# Patient Record
Sex: Female | Born: 1960 | Race: Black or African American | Hispanic: No | Marital: Single | Smoking: Never smoker
Health system: Southern US, Community
[De-identification: ages and names within clinical notes are randomized; demographics above are authoritative.]

## PROBLEM LIST (undated history)

## (undated) DIAGNOSIS — M1990 Unspecified osteoarthritis, unspecified site: Secondary | ICD-10-CM

## (undated) DIAGNOSIS — E559 Vitamin D deficiency, unspecified: Secondary | ICD-10-CM

## (undated) DIAGNOSIS — E739 Lactose intolerance, unspecified: Secondary | ICD-10-CM

## (undated) DIAGNOSIS — N760 Acute vaginitis: Secondary | ICD-10-CM

## (undated) DIAGNOSIS — A599 Trichomoniasis, unspecified: Secondary | ICD-10-CM

## (undated) DIAGNOSIS — K769 Liver disease, unspecified: Secondary | ICD-10-CM

## (undated) DIAGNOSIS — A549 Gonococcal infection, unspecified: Secondary | ICD-10-CM

## (undated) DIAGNOSIS — N809 Endometriosis, unspecified: Secondary | ICD-10-CM

## (undated) DIAGNOSIS — D219 Benign neoplasm of connective and other soft tissue, unspecified: Secondary | ICD-10-CM

## (undated) DIAGNOSIS — K760 Fatty (change of) liver, not elsewhere classified: Secondary | ICD-10-CM

## (undated) DIAGNOSIS — K5900 Constipation, unspecified: Secondary | ICD-10-CM

## (undated) DIAGNOSIS — N939 Abnormal uterine and vaginal bleeding, unspecified: Secondary | ICD-10-CM

## (undated) DIAGNOSIS — Z9889 Other specified postprocedural states: Secondary | ICD-10-CM

## (undated) DIAGNOSIS — B9689 Other specified bacterial agents as the cause of diseases classified elsewhere: Secondary | ICD-10-CM

## (undated) DIAGNOSIS — M549 Dorsalgia, unspecified: Secondary | ICD-10-CM

## (undated) DIAGNOSIS — I10 Essential (primary) hypertension: Secondary | ICD-10-CM

## (undated) DIAGNOSIS — R011 Cardiac murmur, unspecified: Secondary | ICD-10-CM

## (undated) DIAGNOSIS — N946 Dysmenorrhea, unspecified: Secondary | ICD-10-CM

## (undated) DIAGNOSIS — K219 Gastro-esophageal reflux disease without esophagitis: Secondary | ICD-10-CM

## (undated) DIAGNOSIS — Z87442 Personal history of urinary calculi: Secondary | ICD-10-CM

## (undated) DIAGNOSIS — B009 Herpesviral infection, unspecified: Secondary | ICD-10-CM

## (undated) DIAGNOSIS — D869 Sarcoidosis, unspecified: Secondary | ICD-10-CM

## (undated) DIAGNOSIS — B379 Candidiasis, unspecified: Secondary | ICD-10-CM

## (undated) DIAGNOSIS — Z8619 Personal history of other infectious and parasitic diseases: Secondary | ICD-10-CM

## (undated) DIAGNOSIS — N83209 Unspecified ovarian cyst, unspecified side: Secondary | ICD-10-CM

## (undated) DIAGNOSIS — D649 Anemia, unspecified: Secondary | ICD-10-CM

## (undated) DIAGNOSIS — M2550 Pain in unspecified joint: Secondary | ICD-10-CM

## (undated) DIAGNOSIS — R112 Nausea with vomiting, unspecified: Secondary | ICD-10-CM

## (undated) DIAGNOSIS — J189 Pneumonia, unspecified organism: Secondary | ICD-10-CM

## (undated) HISTORY — DX: Herpesviral infection, unspecified: B00.9

## (undated) HISTORY — DX: Endometriosis, unspecified: N80.9

## (undated) HISTORY — DX: Liver disease, unspecified: K76.9

## (undated) HISTORY — PX: LAPAROSCOPIC OVARIAN CYSTECTOMY: SUR786

## (undated) HISTORY — DX: Abnormal uterine and vaginal bleeding, unspecified: N93.9

## (undated) HISTORY — DX: Acute vaginitis: N76.0

## (undated) HISTORY — DX: Gastro-esophageal reflux disease without esophagitis: K21.9

## (undated) HISTORY — DX: Personal history of other infectious and parasitic diseases: Z86.19

## (undated) HISTORY — DX: Pain in unspecified joint: M25.50

## (undated) HISTORY — DX: Unspecified ovarian cyst, unspecified side: N83.209

## (undated) HISTORY — DX: Constipation, unspecified: K59.00

## (undated) HISTORY — DX: Lactose intolerance, unspecified: E73.9

## (undated) HISTORY — DX: Benign neoplasm of connective and other soft tissue, unspecified: D21.9

## (undated) HISTORY — PX: ABDOMINAL HYSTERECTOMY: SHX81

## (undated) HISTORY — DX: Dorsalgia, unspecified: M54.9

## (undated) HISTORY — DX: Gonococcal infection, unspecified: A54.9

## (undated) HISTORY — DX: Essential (primary) hypertension: I10

## (undated) HISTORY — DX: Candidiasis, unspecified: B37.9

## (undated) HISTORY — PX: OTHER SURGICAL HISTORY: SHX169

## (undated) HISTORY — DX: Fatty (change of) liver, not elsewhere classified: K76.0

## (undated) HISTORY — PX: DILATION AND CURETTAGE OF UTERUS: SHX78

## (undated) HISTORY — DX: Anemia, unspecified: D64.9

## (undated) HISTORY — DX: Other specified bacterial agents as the cause of diseases classified elsewhere: B96.89

## (undated) HISTORY — PX: WISDOM TOOTH EXTRACTION: SHX21

## (undated) HISTORY — DX: Dysmenorrhea, unspecified: N94.6

## (undated) HISTORY — DX: Trichomoniasis, unspecified: A59.9

## (undated) HISTORY — DX: Vitamin D deficiency, unspecified: E55.9

## (undated) HISTORY — PX: FOOT SURGERY: SHX648

---

## 1998-02-19 ENCOUNTER — Encounter: Payer: Self-pay | Admitting: Gastroenterology

## 1998-02-19 ENCOUNTER — Ambulatory Visit (HOSPITAL_COMMUNITY): Admission: RE | Admit: 1998-02-19 | Discharge: 1998-02-19 | Payer: Self-pay | Admitting: Gastroenterology

## 1998-05-11 ENCOUNTER — Ambulatory Visit: Admission: RE | Admit: 1998-05-11 | Discharge: 1998-05-11 | Payer: Self-pay | Admitting: Family Medicine

## 1999-02-16 ENCOUNTER — Other Ambulatory Visit: Admission: RE | Admit: 1999-02-16 | Discharge: 1999-02-16 | Payer: Self-pay | Admitting: Obstetrics and Gynecology

## 1999-10-24 ENCOUNTER — Encounter: Payer: Self-pay | Admitting: Obstetrics and Gynecology

## 1999-10-24 ENCOUNTER — Ambulatory Visit (HOSPITAL_COMMUNITY): Admission: RE | Admit: 1999-10-24 | Discharge: 1999-10-24 | Payer: Self-pay | Admitting: Obstetrics and Gynecology

## 1999-10-24 ENCOUNTER — Encounter (INDEPENDENT_AMBULATORY_CARE_PROVIDER_SITE_OTHER): Payer: Self-pay | Admitting: Specialist

## 2000-02-09 ENCOUNTER — Other Ambulatory Visit: Admission: RE | Admit: 2000-02-09 | Discharge: 2000-02-09 | Payer: Self-pay | Admitting: Obstetrics and Gynecology

## 2000-08-23 ENCOUNTER — Encounter (INDEPENDENT_AMBULATORY_CARE_PROVIDER_SITE_OTHER): Payer: Self-pay | Admitting: Specialist

## 2000-08-23 ENCOUNTER — Inpatient Hospital Stay (HOSPITAL_COMMUNITY): Admission: EM | Admit: 2000-08-23 | Discharge: 2000-09-01 | Payer: Self-pay | Admitting: Internal Medicine

## 2000-08-25 ENCOUNTER — Encounter: Payer: Self-pay | Admitting: Critical Care Medicine

## 2000-08-26 ENCOUNTER — Encounter: Payer: Self-pay | Admitting: Internal Medicine

## 2000-08-27 ENCOUNTER — Encounter: Payer: Self-pay | Admitting: Internal Medicine

## 2000-08-29 ENCOUNTER — Encounter: Payer: Self-pay | Admitting: Gastroenterology

## 2000-09-14 ENCOUNTER — Ambulatory Visit (HOSPITAL_COMMUNITY): Admission: RE | Admit: 2000-09-14 | Discharge: 2000-09-14 | Payer: Self-pay | Admitting: Gastroenterology

## 2000-10-03 ENCOUNTER — Observation Stay (HOSPITAL_COMMUNITY): Admission: RE | Admit: 2000-10-03 | Discharge: 2000-10-04 | Payer: Self-pay | Admitting: Gastroenterology

## 2000-10-23 ENCOUNTER — Ambulatory Visit (HOSPITAL_COMMUNITY): Admission: RE | Admit: 2000-10-23 | Discharge: 2000-10-23 | Payer: Self-pay | Admitting: Family Medicine

## 2000-10-23 ENCOUNTER — Encounter: Payer: Self-pay | Admitting: Family Medicine

## 2000-11-08 ENCOUNTER — Encounter: Payer: Self-pay | Admitting: Gastroenterology

## 2000-11-08 ENCOUNTER — Inpatient Hospital Stay (HOSPITAL_COMMUNITY): Admission: EM | Admit: 2000-11-08 | Discharge: 2000-11-11 | Payer: Self-pay | Admitting: Emergency Medicine

## 2001-02-12 ENCOUNTER — Other Ambulatory Visit: Admission: RE | Admit: 2001-02-12 | Discharge: 2001-02-12 | Payer: Self-pay | Admitting: Obstetrics and Gynecology

## 2001-02-13 ENCOUNTER — Ambulatory Visit (HOSPITAL_COMMUNITY): Admission: RE | Admit: 2001-02-13 | Discharge: 2001-02-13 | Payer: Self-pay | Admitting: Obstetrics and Gynecology

## 2001-02-13 ENCOUNTER — Encounter: Payer: Self-pay | Admitting: Obstetrics and Gynecology

## 2001-07-05 ENCOUNTER — Encounter: Admission: RE | Admit: 2001-07-05 | Discharge: 2001-10-03 | Payer: Self-pay | Admitting: Gastroenterology

## 2001-08-05 ENCOUNTER — Encounter: Payer: Self-pay | Admitting: Family Medicine

## 2001-08-05 ENCOUNTER — Ambulatory Visit (HOSPITAL_COMMUNITY): Admission: RE | Admit: 2001-08-05 | Discharge: 2001-08-05 | Payer: Self-pay | Admitting: Family Medicine

## 2001-11-06 ENCOUNTER — Encounter: Admission: RE | Admit: 2001-11-06 | Discharge: 2002-02-04 | Payer: Self-pay | Admitting: Gastroenterology

## 2002-02-12 ENCOUNTER — Other Ambulatory Visit: Admission: RE | Admit: 2002-02-12 | Discharge: 2002-02-12 | Payer: Self-pay | Admitting: Obstetrics and Gynecology

## 2002-04-29 ENCOUNTER — Ambulatory Visit (HOSPITAL_COMMUNITY): Admission: RE | Admit: 2002-04-29 | Discharge: 2002-04-29 | Payer: Self-pay | Admitting: Obstetrics and Gynecology

## 2002-04-29 ENCOUNTER — Encounter: Payer: Self-pay | Admitting: Obstetrics and Gynecology

## 2003-03-09 ENCOUNTER — Other Ambulatory Visit: Admission: RE | Admit: 2003-03-09 | Discharge: 2003-03-09 | Payer: Self-pay | Admitting: Obstetrics and Gynecology

## 2004-02-25 ENCOUNTER — Other Ambulatory Visit: Admission: RE | Admit: 2004-02-25 | Discharge: 2004-02-25 | Payer: Self-pay | Admitting: Obstetrics and Gynecology

## 2004-07-05 ENCOUNTER — Ambulatory Visit (HOSPITAL_COMMUNITY): Admission: RE | Admit: 2004-07-05 | Discharge: 2004-07-05 | Payer: Self-pay | Admitting: Family Medicine

## 2005-02-15 ENCOUNTER — Other Ambulatory Visit: Admission: RE | Admit: 2005-02-15 | Discharge: 2005-02-15 | Payer: Self-pay | Admitting: Obstetrics and Gynecology

## 2005-12-05 ENCOUNTER — Inpatient Hospital Stay (HOSPITAL_COMMUNITY): Admission: RE | Admit: 2005-12-05 | Discharge: 2005-12-08 | Payer: Self-pay | Admitting: Obstetrics and Gynecology

## 2005-12-06 ENCOUNTER — Encounter (INDEPENDENT_AMBULATORY_CARE_PROVIDER_SITE_OTHER): Payer: Self-pay | Admitting: *Deleted

## 2006-01-18 ENCOUNTER — Encounter: Admission: RE | Admit: 2006-01-18 | Discharge: 2006-01-18 | Payer: Self-pay | Admitting: Obstetrics and Gynecology

## 2007-11-28 ENCOUNTER — Encounter: Admission: RE | Admit: 2007-11-28 | Discharge: 2008-01-21 | Payer: Self-pay | Admitting: Obstetrics and Gynecology

## 2008-08-12 ENCOUNTER — Encounter: Admission: RE | Admit: 2008-08-12 | Discharge: 2008-08-12 | Payer: Self-pay | Admitting: Gastroenterology

## 2009-11-24 ENCOUNTER — Encounter: Admission: RE | Admit: 2009-11-24 | Discharge: 2009-11-24 | Payer: Self-pay | Admitting: Gastroenterology

## 2009-11-26 ENCOUNTER — Ambulatory Visit: Payer: Self-pay | Admitting: Vascular Surgery

## 2009-11-26 ENCOUNTER — Ambulatory Visit: Admission: RE | Admit: 2009-11-26 | Discharge: 2009-11-26 | Payer: Self-pay | Admitting: Gastroenterology

## 2009-11-26 ENCOUNTER — Encounter (INDEPENDENT_AMBULATORY_CARE_PROVIDER_SITE_OTHER): Payer: Self-pay | Admitting: Gastroenterology

## 2010-09-16 NOTE — Discharge Summary (Signed)
Yvonne Chapman, Yvonne Chapman                 ACCOUNT NO.:  [id number]   MEDICAL RECORD NO.:  [id number]          PATIENT TYPE:  INP   LOCATION:  9310                          FACILITY:  [facility]   PHYSICIAN:  Hal Morales, M.D.DATE OF BIRTH:  27-Oct-1960   DATE OF ADMISSION:  12/05/2005  DATE OF DISCHARGE:  12/08/2005                                 DISCHARGE SUMMARY   DISCHARGE DIAGNOSES:  1. Symptomatic uterine fibroids.  2. Endometriosis.  3. Pelvic pain.  4. Abnormal uterine bleeding.  5. Pelvic adhesions.  6. Anemia.   PROCEDURES:  On the date of admission, the patient underwent a total  abdominal hysterectomy with left salpingo-oophorectomy, right salpingectomy  and lysis of adhesions tolerating procedures well.  The patient's uterus was  enlarged to approximately 10-12 weeks size with the weight being  approximately 280 g.  There were multiple myomas with the left ovary densely  adherent to the left pelvic sidewall and contained an endometriotic cyst.  The left tube was slightly dilated. The right ovary was adherent with filmy  adhesions and a corpus luteum cyst was present.  The right tube appeared  normal.  The uterus was densely adherent to the rectosigmoid posteriorly.   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  Please see the patient's dictated history and  physical examination for details.   PHYSICAL EXAMINATION:  VITAL SIGNS:  Preoperative physical exam blood  pressure 112/70, height 5 feet 7 inches tall, weight 191.  GENERAL:  Exam was within normal limits.  ABDOMEN:  Her bowel sounds were present and there was diffuse tenderness  without guarding, rebound or masses or organomegaly.  PELVIC:  EG/BUS was within normal limits.  Vagina was rugose.  Cervix did  have a small amount of pink, vaginal discharge.  Her cervix was without  tenderness or lesions.  Uterus was tender measuring approximately 12- to 14-  week size, although the patient's exam was limited by her body habitus.  Her  adnexa was without masses though tender bilaterally.  Rectovaginal exam was  without tenderness or masses.   HOSPITAL COURSE:  On the day of admission, the patient underwent  aforementioned procedures tolerating them all well.  The patient's  postoperative course was marked by slow return of bowel function and a  persistent headache.  Postop hemoglobin was 9.40 (preop hemoglobin 11.6).  By postop day #3, however, the patient's headache had markedly improved.  She had resumed bowel and bladder function and was therefore deemed ready  for discharge home.   DISCHARGE MEDICATIONS:  1. Percocet 5/325 one to two tablets every 6 hours as needed for pain.  2. Ibuprofen 600 mg every 6 hours with food x3 days, then as needed for      pain.  3. Iron twice daily x6 weeks.  4. Colace 100 mg twice daily until bowel movements  are regular.  5. Phenergan 12.5 mg every 6 hours as needed for nausea.   FOLLOW UP:  The patient is scheduled for a 6 weeks postoperative visit with  Dr. Pennie Rushing on January 18, 2006, at 11 a.m.   DISCHARGE INSTRUCTIONS:  The patient was given a copy of [facility] postoperative instruction sheet.   ACTIVITY:  She was further advised to avoid driving x2 weeks, heavy lifting  x4 weeks, intercourse x6 weeks.  She may shower, walk up steps and should  increase her activity slowly.   DIET:  The patient's diet was without restriction.   FINAL PATHOLOGY:  Uterus, cervix, left ovary, right and left fallopian  tubes:  Cervix with chronic cervicitis with squamous metaplasia, no  intraepithelial lesion identified.  Endometrium with proliferative  endometrium, no  hyperplasia or malignancy identified.  Myometrium with  multiple leiomyomas.  Right fallopian tube with benign fallopian tube.  Left  ovary with hemorrhagic corpus luteum surface adhesions.  Left fallopian tube  benign fallopian tube.      [name].      Hal Morales, M.D.  Electronically Signed    EJP/MEDQ  D:  01/13/2006  T:  01/15/2006  Job:  [id number]

## 2010-09-16 NOTE — Procedures (Signed)
[facility]  Patient:    Yvonne Chapman, Yvonne Chapman                        MRN: [id number] Proc. Date: 10/03/00 Adm. Date:  [id number] Attending:  Louie Bun CC:         Elana Alm Eliezer Lofts., M.D.  Anthony Sar, M.D., [facility], Gastroenterology  Unk Lightning, M.D., [facility], Pulmonary Dept.   Procedure Report  PROCEDURE:  Esophagogastroduodenoscopy with banding of esophageal varices.  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  This is her third session of elective banding to try to achieve obliteration of the varices.  DESCRIPTION OF PROCEDURE:   The patient was placed in the left lateral decubitus position and placed on the pulse monitor with continuous low-flow oxygen delivered by nasal cannula.  She was sedated with 80 mg IV Demerol and 8 mg IV Versed.  The Olympus video endoscope was advanced under direct vision into the oropharynx and esophagus.  The esophagus contained moderate-sized esophageal varices which seemed the most prominent at about 35 cm which was about 5 cm from the GE junction which was where the squamocolumnar line was sharply outlined.  The stomach was entered, and there was moderate hypertrophy of the gastric rugal folds. 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  No other abnormalities were noted in the stomach or duodenum. The scope was withdrawn, and the banding apparatus attached to the scope, and it was reintroduced and passed to about 35 cm where one of the more bulging varices was suctioned into the banding apparatus and a band placed.  This resulted in fairly brisk bleeding which did not appear to cease during the remainder of the study.  I moved the scope slightly proximal and placed a second band on another varix.  The bleeding continued to be fairly brisk  at that point, and there did not appear to be any significant large varices above where the second band was placed.  I tried gently passing the scope more distally to see if there were any more distal varices needing banding, but there was resistance from the banded varices, and the scope would not pass easily; therefore, the scope was withdrawn and this procedure terminated.  The main concerns were over the possibility of the banding procedure inducing significant bleeding, and it was decided to keep the patient in recovery for a prolonged amount of time and a CBC was obtained before the procedure, and we will consider obtaining one later if vital signs unstable.  IMPRESSION:  Esophageal varices, status post banding x 2 with banding triggering bleeding.  Unclear whether self-limiting or not.  PLAN:  We will continue propanolol.  We will watch in the recovery room for signs of ongoing bleeding prior to discharge today. DD:  10/03/00 TD:  10/03/00 Job: [id number]

## 2010-09-16 NOTE — Op Note (Signed)
Yvonne Chapman                 ACCOUNT NO.:  [id number]   MEDICAL RECORD NO.:  [id number]          PATIENT TYPE:  INP   LOCATION:  9310                          FACILITY:  [facility]   PHYSICIAN:  Hal Morales, M.D.DATE OF BIRTH:  Feb 09, 1961   DATE OF PROCEDURE:  12/05/2005  DATE OF DISCHARGE:                                 OPERATIVE REPORT   PREOPERATIVE DIAGNOSES:  1. Symptomatic uterine fibroids.  2. Endometriosis.  3. Pelvic pain.  4. Abnormal uterine bleeding.   POSTOPERATIVE DIAGNOSES:  1. Symptomatic uterine fibroids.  2. Endometriosis.  3. Pelvic pain.  4. Abnormal uterine bleeding.  5. Pelvic adhesions.   PROCEDURE:  1. Total abdominal hysterectomy.  2. Left salpingo-oophorectomy.  3. Right salpingectomy.  4. Lysis of adhesions.   SURGEON:  Dr. Dierdre Forth.   FIRST ASSISTANT:  Dr. Marsh Dolly.   ANESTHESIA:  General orotracheal.   ESTIMATED BLOOD LOSS:  1200 mL.   COMPLICATIONS:  None.   FINDINGS:  The uterus was enlarged to approximately 10-12 weeks size.  The  weight was approximately 280 grams.  There were multiple myomata.  The left  ovary was densely adherent to the left pelvic sidewall and contained an  endometriotic cyst.  The left tube was slightly dilated.  The right ovary  was adherent with filmy adhesions and a corpus luteum cyst was present.  The  right tube appeared normal.  The uterus was densely adherent to the  rectosigmoid posteriorly.   PROCEDURE:  The patient was taken to the operating room, after appropriate  identification, and placed on the operating table.  After the attainment of  adequate general anesthesia, the abdomen, perineum and vagina were prepped  with multiple layers of Betadine.  A Foley catheter was inserted into the  bladder and connected to straight drainage.  The abdomen was draped as a  sterile field.  Suprapubic infiltration of 0.25% Marcaine for 20 mL was  undertaken.  A suprapubic incision was made  and the abdomen opened in  layers.  The peritoneum was entered and a self-retaining, O'Connor-  O'Sullivan retractor placed in the incision.  The bowel was packed cephalad.  The uterus was grasped at each cornual region with [facility] clamp, and it was  noted that elevation into the operative field was limited by dense adhesions  to the rectosigmoid and obliteration of the posterior cul-de-sac.  The left  round ligament was then identified, suture ligated and incised, and that  incision taken anteriorly on the anterior leaf of the broad ligament.  The  utero-ovarian ligament was identified and clamped, cut and suture-ligated in  two pieces.  The right round ligament and right utero-ovarian ligaments were  treated similarly.  The bladder was dissected off the anterior cervix with a  combination of blunt and sharp dissection.  The posterior uterine serosa was  then incised, and the sigmoid colon dissected off the posterior uterus with  the development of the rectovaginal septum.  The uterine arteries on the  right and left side were skeletonized and then clamped, cut and suture  ligated.  The uterine fundus was excised from the cervix, and removed from  the operative field.  Further dissection of the sigmoid off the posterior  cul-de-sac was undertaken, and the pubocervical fascia identified, incised,  and the vagina incised just below the cervix.  The cervix was then excised  from the upper vagina, from anterior to posterior, keeping the vaginal  mucosa and the underlying tissue clamped together with Kocher clamps.  Once  the cervix was excised, the vaginal angles were sutured with Richardson  sutures, incorporating the vaginal angle as well as the vaginal mucosa in a  single stitch.  The remainder of the vaginal cuff was closed, incorporating  the denuded peritoneum from the posterior cul-de-sac.  The vaginal mucosa  posteriorly, vaginal mucosa anteriorly, and the pubocervical fascia   anteriorly in figure-of-eight sutures, allowing for complete closure of the  cuff.  Hemostasis was achieved with a combination of Bovie cautery and small  sutures.  Copious irrigation was carried out and a warm pack placed in the  pelvis.  The left ovary was then further investigated and noted to contain  an endometrioma with dense adhesions.  Those adhesions were sharply lysed,  allowing the ovary to be elevated into the operative field, along with the  left fallopian tube.  The infundibulopelvic ligament was isolated, and the  left ureter identified.  The infundibulopelvic ligament was then clamped,  cut, tied with a free tie, and suture ligated, and the left tube and ovary  removed from the operative field.  The right tube and ovary were then  investigated and found to contain a corpus luteum cyst, but appeared  salvageable with no apparent evidence of endometriosis.  The right fallopian  tube was then excised and the mesosalpinx oversewn with interrupted sutures  of 3-0 Vicryl.  Hemostasis was noted to be adequate and copious irrigation  was carried out.  A single hemostatic suture was then placed in the vaginal  cuff area and hemostasis noted to be adequate.  All instruments were then  removed from the peritoneal cavity, and the abdominal peritoneum closed with  a running suture of 2-0 Vicryl.  The rectus muscles were made hemostatic  with Bovie cautery and irrigated.  The rectus fascia was closed with running  suture from each apex to the midline and tied in the midline.  The  subcutaneous tissue was copiously irrigated and made hemostatic with Bovie  cautery.  A subcutaneous drain was placed through a stab wound in the right  lower quadrant, and the drain sewn in with a suture of zero silk.  The  grenade was placed on the drain.  The skin incision was then closed with  subcuticular suture of 3-0 Monocryl.  The incision was infiltrated with another 10 mL of 0.25% Marcaine.  A sterile  pressure dressing was applied,  and the patient awakened from general anesthesia and taken to the recovery  room in satisfactory condition, having tolerated the procedure well, with  sponge and instrument counts correct.   SPECIMENS TO PATHOLOGY:  Uterus, cervix, left tube and ovary, and right  fallopian tube.      Hal Morales, M.D.  Electronically Signed     VPH/MEDQ  D:  12/05/2005  T:  12/05/2005  Job:  [id number]   cc:   Everardo All. Madilyn Fireman, M.D.  Fax: [phone]

## 2010-09-16 NOTE — H&P (Signed)
[name], [name]                 ACCOUNT NO.:  [id number]   MEDICAL RECORD NO.:  [id number]          PATIENT TYPE:  AMB   LOCATION:  SDC                           FACILITY:  [facility]   PHYSICIAN:  Hal Morales, M.D.DATE OF BIRTH:  14-Nov-1960   DATE OF ADMISSION:  12/05/2005  DATE OF DISCHARGE:                                HISTORY & PHYSICAL   HISTORY:  [name] is a 50 year old single African/American female with a  history of sarcoidosis, para 0, 0, 2, 0, who presents for a total abdominal  hysterectomy with the possibility of bilateral salpingo-oophorectomy because  of symptomatic uterine fibroids, endometriosis, pelvic pain and irregular  menses.  For the past several years, the patient has experienced with and  without her periods, what she states is a 5-6/10 crampy abdominal pelvic  pain.  She knows of no aggravating or alleviating factors and tries to avoid  analgesics due to her history of hepatic sarcoidosis.  She also will have a  menstrual flow that occurs anywhere from twice monthly to every 58 days.  This flow will last for seven days, causing her to change a pad plus a  tampon every 1 to 1-1/2 hours.  She admits to urinary urgency and frequency,  along with lower back pain during these bleeding episodes but denies  dysuria, fever, nausea, vomiting or diarrhea.  A pelvic ultrasound in July  2007, revealed a uterus measuring 10 cm x 6.4 cm x 9.6 cm with four  measurable fibroids ranging from 3 cm to 5.12 cm.  A TSH performed in June  2007, was within normal limits.  A discussion was held with the patient  regarding management options for her symptoms, which included medical  (limited due to her liver disease), along with surgical and other  interventions; (uterine embolization was included;) however, the patient has  chosen to finish her therapy in the form of hysterectomy.   PAST MEDICAL HISTORY:  OB HISTORY:  Gravida 2, para 0, 0, 2, 0.  Elective  terminations of  pregnancy x 2   GYN HISTORY:  Menarche at 50 years of age.  The patient's last menstrual  period was November 22, 2005.  She uses abstinence as a method of  contraception.  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  She has a history of an abnormal Pap smear;  however, her colposcopy done at that time returned negative.  The patient  had a normal Pap smear in October 2006, and a normal mammogram in February  2007.   MEDICAL HISTORY:  1. Anemia.  2. GI bleed.  3. Depression.  4. Sarcoidosis with liver (elevated liver function tests), and pulmonary      (normal pulmonary function tests), involvement.   PAST SURGICAL HISTORY:  1. In 1997, bilateral foot surgery.  2. Right breast biopsy - fibroadenoma.  3. Laparoscopy for pelvic pain - endometriosis.  4. In 1998, a D&C.  5. In 2001, a laparoscopy with left ovarian cystectomy.  6. In 2002, vocal cord surgery.  7.  In 2002, placement of a cardiohepatic shunt.  8. In 2005, revision of cardiohepatic shunt.   FAMILY HISTORY:  Hypertension, diabetes mellitus, cardiovascular disease,  rheumatic fever, seizure disorder, migraines and gout.   HABITS:  The patient does not use alcohol or tobacco.   SOCIAL HISTORY:  The patient is single.  She works for [employer] of  the [employer].   CURRENT MEDICATIONS:  None.   ALLERGIES:  No known drug allergies; however, she is sensitive to SULFA,  FLOXIN AND METRONIDAZOLE, all of which cause nausea and vomiting.   REVIEW OF SYSTEMS:  The patient has occasional constipation and dyspepsia.  She wears glasses.  Except as mentioned in the history of present illness,  the patient's review of systems is negative.   PHYSICAL EXAMINATION:  VITAL SIGNS:  Blood pressure 112/70, height 5 feet 7  inches tall, weight 191 pounds.  NECK:  Supple without masses.  There is no thyromegaly or cervical  adenopathy.  HEART:  A regular rate and rhythm.  No murmur.   LUNGS:  Clear to auscultation.  There are no wheezes, rales or rhonchi.  CHEST:  There is mild kyphosis.  BACK:  No CVA tenderness.  ABDOMEN:  Bowel sounds were present.  There was diffuse tenderness without  guarding, rebound, masses or organomegaly.  EXTREMITIES:  No clubbing, cyanosis or edema.  PELVIC:  EG, BUS within normal limits.  Vagina is rugous.  The patient does  have a small amount of pink discharge.  Cervix nontender without lesion.  Uterus was tender, measuring approximately 12-14 weeks size.  The patient's  exam was limited by her body habitus.  Adnexa without masses though tender  bilaterally.  RECTOVAGINAL:  Exam without tenderness or masses.   IMPRESSION:  1. Pelvic pain.  2. Uterine fibroids.  3. Endometriosis.  4. Irregular menses.  5. Hepatic and pulmonary sarcoidosis   DISPOSITION:  A discussion was held with the patient regarding the  indications for her procedure along with its risks, especially in light of  her liver disease which include, but are not limited to reaction to  anesthesia, excessive  bleeding, infection and damage to adjacent organs (especially in light of  her endometriosis).  The patient verbalized understanding of these risks and  has consented to proceed with a total abdominal hysterectomy with the  possibility of a bilateral salpingo-oophorectomy at [facility]  [place] on December 05, 2005, at 12:30 p.m.      [name].      Hal Morales, M.D.  Electronically Signed    EJP/MEDQ  D:  12/01/2005  T:  12/01/2005  Job:  [id number]

## 2011-01-16 ENCOUNTER — Other Ambulatory Visit (HOSPITAL_COMMUNITY): Payer: Self-pay | Admitting: Gastroenterology

## 2011-01-27 ENCOUNTER — Encounter (HOSPITAL_COMMUNITY)
Admission: RE | Admit: 2011-01-27 | Discharge: 2011-01-27 | Disposition: A | Discharge status: Home / Self Care | Payer: Federal, State, Local not specified - PPO | Source: Ambulatory Visit | Attending: Gastroenterology | Admitting: Gastroenterology

## 2011-01-27 DIAGNOSIS — R1032 Left lower quadrant pain: Secondary | ICD-10-CM | POA: Insufficient documentation

## 2011-01-27 DIAGNOSIS — R110 Nausea: Secondary | ICD-10-CM | POA: Insufficient documentation

## 2011-01-27 MED ORDER — TECHNETIUM TC 99M SULFUR COLLOID
2.0000 | Freq: Once | INTRAVENOUS | Status: AC | PRN
  Administered 2011-01-27: 2 via INTRAVENOUS

## 2011-02-06 ENCOUNTER — Other Ambulatory Visit (HOSPITAL_COMMUNITY): Payer: Self-pay

## 2011-02-06 ENCOUNTER — Other Ambulatory Visit: Payer: Self-pay | Admitting: Gastroenterology

## 2011-02-09 ENCOUNTER — Ambulatory Visit
Admission: RE | Admit: 2011-02-09 | Discharge: 2011-02-09 | Disposition: A | Discharge status: Home / Self Care | Payer: Federal, State, Local not specified - PPO | Source: Ambulatory Visit | Attending: Gastroenterology | Admitting: Gastroenterology

## 2011-08-08 ENCOUNTER — Other Ambulatory Visit: Payer: Self-pay | Admitting: Gastroenterology

## 2011-08-08 ENCOUNTER — Ambulatory Visit
Admission: RE | Admit: 2011-08-08 | Discharge: 2011-08-08 | Disposition: A | Discharge status: Home / Self Care | Payer: Federal, State, Local not specified - PPO | Source: Ambulatory Visit | Attending: Gastroenterology | Admitting: Gastroenterology

## 2011-08-08 DIAGNOSIS — R110 Nausea: Secondary | ICD-10-CM

## 2011-08-12 ENCOUNTER — Encounter (HOSPITAL_COMMUNITY): Payer: Self-pay

## 2011-08-12 ENCOUNTER — Emergency Department (HOSPITAL_COMMUNITY)
Admission: EM | Admit: 2011-08-12 | Discharge: 2011-08-12 | Disposition: A | Discharge status: Home / Self Care | Payer: Federal, State, Local not specified - PPO | Attending: Emergency Medicine | Admitting: Emergency Medicine

## 2011-08-12 ENCOUNTER — Emergency Department (HOSPITAL_COMMUNITY): Payer: Federal, State, Local not specified - PPO

## 2011-08-12 DIAGNOSIS — R0602 Shortness of breath: Secondary | ICD-10-CM | POA: Insufficient documentation

## 2011-08-12 DIAGNOSIS — R071 Chest pain on breathing: Secondary | ICD-10-CM | POA: Insufficient documentation

## 2011-08-12 DIAGNOSIS — R079 Chest pain, unspecified: Secondary | ICD-10-CM | POA: Insufficient documentation

## 2011-08-12 DIAGNOSIS — R0789 Other chest pain: Secondary | ICD-10-CM

## 2011-08-12 HISTORY — DX: Pneumonia, unspecified organism: J18.9

## 2011-08-12 HISTORY — DX: Sarcoidosis, unspecified: D86.9

## 2011-08-12 LAB — CBC
HCT: 37.7 % (ref 36.0–46.0)
Hemoglobin: 12.6 g/dL (ref 12.0–15.0)
MCV: 81.3 fL (ref 78.0–100.0)
Platelets: 176 10*3/uL (ref 150–400)
RBC: 4.64 MIL/uL (ref 3.87–5.11)
WBC: 3.2 10*3/uL — ABNORMAL LOW (ref 4.0–10.5)

## 2011-08-12 LAB — PRO B NATRIURETIC PEPTIDE: Pro B Natriuretic peptide (BNP): 12.9 pg/mL (ref 0–125)

## 2011-08-12 LAB — DIFFERENTIAL
Basophils Absolute: 0 10*3/uL (ref 0.0–0.1)
Basophils Relative: 1 % (ref 0–1)
Eosinophils Absolute: 0.2 10*3/uL (ref 0.0–0.7)
Monocytes Absolute: 0.3 10*3/uL (ref 0.1–1.0)
Neutro Abs: 1.5 10*3/uL — ABNORMAL LOW (ref 1.7–7.7)
Neutrophils Relative %: 46 % (ref 43–77)

## 2011-08-12 LAB — BASIC METABOLIC PANEL
CO2: 28 mEq/L (ref 19–32)
Chloride: 102 mEq/L (ref 96–112)
Creatinine, Ser: 0.64 mg/dL (ref 0.50–1.10)
Glucose, Bld: 105 mg/dL — ABNORMAL HIGH (ref 70–99)

## 2011-08-12 MED ORDER — ASPIRIN 81 MG PO CHEW
324.0000 mg | CHEWABLE_TABLET | Freq: Once | ORAL | Status: AC
  Administered 2011-08-12: 324 mg via ORAL
  Filled 2011-08-12: qty 4

## 2011-08-12 MED ORDER — PREDNISONE 50 MG PO TABS: ORAL_TABLET | ORAL | Status: AC

## 2011-08-12 MED ORDER — SODIUM CHLORIDE 0.9 % IV BOLUS (SEPSIS)
1000.0000 mL | Freq: Once | INTRAVENOUS | Status: AC
  Administered 2011-08-12: 1000 mL via INTRAVENOUS

## 2011-08-12 MED ORDER — SODIUM CHLORIDE 0.9 % IV SOLN: Freq: Once | INTRAVENOUS | Status: DC

## 2011-08-12 MED ORDER — GI COCKTAIL ~~LOC~~
30.0000 mL | Freq: Once | ORAL | Status: AC
  Administered 2011-08-12: 30 mL via ORAL
  Filled 2011-08-12: qty 30

## 2011-08-12 NOTE — ED Provider Notes (Signed)
History     CSN: [id number]  Arrival date & time 08/12/11  8295   First MD Initiated Contact with Patient 08/12/11 1006      Chief Complaint  Patient presents with  . Chest Pain    dx with pneumonia    (Consider location/radiation/quality/duration/timing/severity/associated sxs/prior treatment) HPI  Patient presents to the ER complaining of a 3 week hx of gradual onset constant anterior/substernal chest discomfort that she describes as a constant "heavy weight on my chest" that is aggravated by movement. Patient states that her PCP dx her with bronchial PNA last week by chest xray and prescribed augmentin that she is still currently taking. Patient states that she called her PCP and described the ongoing chest discomfort explaining that pain is aggravated by movement such as sitting forward, lying down and movement of chest wall such as flailing of chest and PCP urged her to go to ER. She states she has some mild intermittent SOB and occasionally the discomfort will radiate down left arm and be associated with nausea. Pain is constant with aggravated increases in pain. Denies exertional aggravation. 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 Patient's PCP is through [facility]. She is followed by Dr. Madilyn Fireman [facility] for sarcoid of liver. Patient states she has felt intermittently flushed and diaphoretic but does not correlate with increased CP. She denies lower extremity pain or swelling. She denies recent travel or immobilization. She denies early family hx of heart disease or heart attack.   Past Medical History  Diagnosis Date  . Pneumonia   . Sarcoidosis     History reviewed. No pertinent past surgical history.  No family history on file.  History  Substance Use Topics  . Smoking status: Never Smoker   . Smokeless tobacco: Not on file  . Alcohol  Use: No    OB History    Grav Para Term Preterm Abortions TAB SAB Ect Mult Living                  Review of Systems  All other systems reviewed and are negative.    Allergies  Metronidazole and Sulfur  Home Medications   Current Outpatient Rx  Name Route Sig Dispense Refill  . ACETAMINOPHEN 500 MG PO TABS Oral Take 500 mg by mouth every 6 (six) hours as needed. pain    . AMOXICILLIN-POT CLAVULANATE 875-125 MG PO TABS Oral Take 1 tablet by mouth 2 (two) times daily.    . ADULT MULTIVITAMIN W/MINERALS CH Oral Take 1 tablet by mouth daily.      BP 130/83  Pulse 98  Temp(Src) 99 F (37.2 C) (Oral)  Resp 18  SpO2 98%  Physical Exam  Nursing note and vitals reviewed. Constitutional: She is oriented to person, place, and time. She appears well-developed and well-nourished. No distress.  HENT:  Head: Normocephalic and atraumatic.  Eyes: Conjunctivae are normal.  Neck: Normal range of motion. Neck supple.  Cardiovascular: Normal rate, regular rhythm, normal heart sounds and intact distal pulses.  Exam reveals no gallop and no friction rub.   No murmur heard. Pulmonary/Chest: Effort normal and breath sounds normal. No respiratory distress. She has no wheezes. She has no rales. She exhibits tenderness.       TTP of sternal region without skin changes or crepitous.   Abdominal: Soft. Bowel sounds are normal. She exhibits  no distension and no mass. There is no tenderness. There is no rebound and no guarding.  Musculoskeletal: Normal range of motion. She exhibits no edema and no tenderness.  Neurological: She is alert and oriented to person, place, and time.  Skin: Skin is warm and dry. No rash noted. She is not diaphoretic. No erythema.  Psychiatric: She has a normal mood and affect.    ED Course  Procedures (including critical care time)  PO ASA and GI cocktail.   Date: 08/12/2011  Rate: 83  Rhythm: normal sinus rhythm  QRS Axis: normal  Intervals: normal  ST/T Wave  abnormalities: normal  Conduction Disutrbances: none  Narrative Interpretation:   Old EKG Reviewed: non provocative compared to November 08, 2000 with noo significant changes noted    Labs Reviewed  CBC - Abnormal; Notable for the following:    WBC 3.2 (*)    All other components within normal limits  BASIC METABOLIC PANEL - Abnormal; Notable for the following:    Potassium 3.4 (*)    Glucose, Bld 105 (*)    All other components within normal limits  DIFFERENTIAL - Abnormal; Notable for the following:    Neutro Abs 1.5 (*)    All other components within normal limits  PRO B NATRIURETIC PEPTIDE  TROPONIN I  POCT I-STAT TROPONIN I   Dg Chest 2 View  08/12/2011  *RADIOLOGY REPORT*  Clinical Data: Short of breath, chest pain  CHEST - 2 VIEW  Comparison: Chest radiograph 08/08/2011  Findings: Normal mediastinum and heart silhouette.  No effusion, infiltrate, or pneumothorax.  There is improvement in the air space disease the right lower lobe described on prior.  TIPS shunt noted.  IMPRESSION: Improved air space disease.  No evidence of edema or infiltrate.  Original Report Authenticated By: Genevive Bi, M.D.     1. Chest wall pain    Dr. Freida Busman evaluated patient at bedside and is agreeable with assessment and plan.    MDM  Constant pain x 3 weeks with normal EKG and negative troponin with with risk for ACS or PE. Reproducible CP with improving PNA. Patient agreeable to following up with PCP next week. Gave strict precautions for return to ER. Patient voices understanding.         Jenness [place], [place] 08/12/11 1151

## 2011-08-12 NOTE — ED Notes (Signed)
Pt in from home with chest pain x1 week states sob and pain radiates to the left arm pt recently dx with pneumonia states pressure and tightness denies n/v

## 2011-08-12 NOTE — ED Notes (Signed)
Pt voiced that she did not want to take Prednisone.  Advised pt to f/u with PCP if decided not to take the Rx.

## 2011-08-12 NOTE — Discharge Instructions (Signed)
Your pneumonia is improving on your chest xray. Take prednisone as directed for the next 5 days and continue to alternate between tylenol and ibuprofen for pain. Follow up with your primary care provider next week for recheck of symptoms. Return to ER for changing or worsening of symptoms.   Chest Wall Pain Chest wall pain is pain in or around the bones and muscles of your chest. It may take up to 6 weeks to get better. It may take longer if you must stay physically active in your work and activities.  CAUSES  Chest wall pain may happen on its own. However, it may be caused by:  A viral illness like the flu.   Injury.   Coughing.   Exercise.   Arthritis.   Fibromyalgia.   Shingles.  HOME CARE INSTRUCTIONS   Avoid overtiring physical activity. Try not to strain or perform activities that cause pain. This includes any activities using your chest or your abdominal and side muscles, especially if heavy weights are used.   Put ice on the sore area.   Put ice in a plastic bag.   Place a towel between your skin and the bag.   Leave the ice on for 15 to 20 minutes per hour while awake for the first 2 days.   Only take over-the-counter or prescription medicines for pain, discomfort, or fever as directed by your caregiver.  SEEK IMMEDIATE MEDICAL CARE IF:   Your pain increases, or you are very uncomfortable.   You have a fever.   Your chest pain becomes worse.   You have new, unexplained symptoms.   You have nausea or vomiting.   You feel sweaty or lightheaded.   You have a cough with phlegm (sputum), or you cough up blood.  MAKE SURE YOU:   Understand these instructions.   Will watch your condition.   Will get help right away if you are not doing well or get worse.  Document Released: 04/17/2005 Document Revised: 04/06/2011 Document Reviewed: 12/12/2010 [facility] Patient Information 2012 [place], [place].

## 2011-08-12 NOTE — ED Provider Notes (Signed)
Medical screening examination/treatment/procedure(s) were conducted as a shared visit with non-physician practitioner(s) and myself.  I personally evaluated the patient during the encounter  Patient with constant chest wall pain times one month worse with movement and positions. She is currently taking antibiotics for her pneumonia and her chest x-ray today is improved. On physical exam patient is tender at the distal sternum/subxiphoid region. No concern for a year pulmonary embolism suspect patient has chest wall pain. She is to be placed on prednisone and will followup with her Dr. Next week  Toy Baker, MD 08/12/11 1131

## 2011-08-15 NOTE — ED Provider Notes (Signed)
Medical screening examination/treatment/procedure(s) were conducted as a shared visit with non-physician practitioner(s) and myself.  I personally evaluated the patient during the encounter  Toy Baker, MD 08/15/11 [phone]

## 2011-08-24 ENCOUNTER — Other Ambulatory Visit: Payer: Self-pay | Admitting: Family Medicine

## 2011-08-24 ENCOUNTER — Ambulatory Visit
Admission: RE | Admit: 2011-08-24 | Discharge: 2011-08-24 | Disposition: A | Discharge status: Home / Self Care | Payer: Federal, State, Local not specified - PPO | Source: Ambulatory Visit | Attending: Family Medicine | Admitting: Family Medicine

## 2011-08-24 DIAGNOSIS — J189 Pneumonia, unspecified organism: Secondary | ICD-10-CM

## 2011-12-07 ENCOUNTER — Ambulatory Visit: Payer: Self-pay | Admitting: Obstetrics and Gynecology

## 2011-12-22 DIAGNOSIS — A599 Trichomoniasis, unspecified: Secondary | ICD-10-CM

## 2011-12-22 DIAGNOSIS — D219 Benign neoplasm of connective and other soft tissue, unspecified: Secondary | ICD-10-CM

## 2011-12-22 DIAGNOSIS — N946 Dysmenorrhea, unspecified: Secondary | ICD-10-CM

## 2011-12-22 DIAGNOSIS — A549 Gonococcal infection, unspecified: Secondary | ICD-10-CM | POA: Insufficient documentation

## 2011-12-22 DIAGNOSIS — D649 Anemia, unspecified: Secondary | ICD-10-CM

## 2011-12-22 DIAGNOSIS — B379 Candidiasis, unspecified: Secondary | ICD-10-CM

## 2011-12-22 DIAGNOSIS — D869 Sarcoidosis, unspecified: Secondary | ICD-10-CM

## 2011-12-22 DIAGNOSIS — N939 Abnormal uterine and vaginal bleeding, unspecified: Secondary | ICD-10-CM | POA: Insufficient documentation

## 2011-12-22 DIAGNOSIS — B9689 Other specified bacterial agents as the cause of diseases classified elsewhere: Secondary | ICD-10-CM | POA: Insufficient documentation

## 2011-12-22 DIAGNOSIS — N83209 Unspecified ovarian cyst, unspecified side: Secondary | ICD-10-CM

## 2011-12-22 DIAGNOSIS — N809 Endometriosis, unspecified: Secondary | ICD-10-CM | POA: Insufficient documentation

## 2011-12-22 DIAGNOSIS — N760 Acute vaginitis: Secondary | ICD-10-CM

## 2011-12-22 DIAGNOSIS — B009 Herpesviral infection, unspecified: Secondary | ICD-10-CM | POA: Insufficient documentation

## 2011-12-28 ENCOUNTER — Encounter: Payer: Self-pay | Admitting: Obstetrics and Gynecology

## 2011-12-28 ENCOUNTER — Ambulatory Visit (INDEPENDENT_AMBULATORY_CARE_PROVIDER_SITE_OTHER): Payer: Federal, State, Local not specified - PPO | Admitting: Obstetrics and Gynecology

## 2011-12-28 VITALS — BP 112/76 | Ht 66.75 in | Wt 214.0 lb

## 2011-12-28 DIAGNOSIS — N809 Endometriosis, unspecified: Secondary | ICD-10-CM

## 2011-12-28 NOTE — Progress Notes (Signed)
Subjective:    Yvonne Chapman is a 51 y.o. female G2P0 who presents for annual exam.   AEX Pt voices no complaints  Last Pap: 08/15/2007  WNL: Yes Regular Periods:no Contraception: TAH-LSO  Monthly Breast exam:yes Tetanus<57yrs:? Nl.Bladder Function:yes Daily BMs:no Healthy Diet:yes Calcium:no Mammogram:yes Date of Mammogram: 2013-wnl per pt @ [facility] Exercise:yes Have often Exercise: occ Seatbelt: yes Abuse at home: no Stressful work:no Sigmoid-colonoscopy: 2012-wnl per pt @ [facility] Bone Density: No PCP: Dr. Tiburcio Pea [facility]) Change in PMH: no change    Change in FMH:no change BMI=33   The following portions of the patient's history were reviewed and updated as appropriate: allergies, current medications, past family history, past medical history, past social history, past surgical history and problem list.  Review of Systems Pertinent items are noted in HPI. Gastrointestinal:No change in bowel habits, no abdominal pain, no rectal bleeding Genitourinary:negative for dysuria, frequency, hematuria, nocturia and urinary incontinence    Objective:     BP 112/76  Ht 5' 6.75" (1.695 m)  Wt 214 lb (97.07 kg)  BMI 33.77 kg/m2  Weight:  Wt Readings from Last 1 Encounters:  12/28/11 214 lb (97.07 kg)     BMI: Body mass index is 33.77 kg/(m^2). General Appearance: Alert, appropriate appearance for age. No acute distress HEENT: Grossly normal Neck / Thyroid: Supple, no masses, nodes or enlargement Lungs: clear to auscultation bilaterally Back: No CVA tenderness Breast Exam: No masses or nodes.No dimpling, nipple retraction or discharge. Cardiovascular: Regular rate and rhythm. S1, S2, no murmur Gastrointestinal: Soft, non-tender, no masses or organomegaly Pelvic Exam: External genitalia: normal general appearance Vaginal: normal rugae. Vault healed and well suspended Cervix: removed surgically Adnexa: non palpable Uterus: absent Exam limited by body habitus Rectovaginal:  normal rectal, no masses Lymphatic Exam: Non-palpable nodes in neck, clavicular, axillary, or inguinal regions Skin: no rash or abnormalities Neurologic: Normal gait and speech, no tremor  Psychiatric: Alert and oriented, appropriate affect.    Urinalysis:Not done    Assessment:    Normal gyn exam after hysterectomy Hx endometriosis, asymptomatic since TAH-LSO   Plan:   mammogram return annually or prn   Dierdre Forth, MD

## 2012-03-27 DIAGNOSIS — K7460 Unspecified cirrhosis of liver: Secondary | ICD-10-CM | POA: Insufficient documentation

## 2012-03-27 DIAGNOSIS — Z95828 Presence of other vascular implants and grafts: Secondary | ICD-10-CM

## 2012-03-27 DIAGNOSIS — K766 Portal hypertension: Secondary | ICD-10-CM

## 2012-03-27 HISTORY — DX: Unspecified cirrhosis of liver: K74.60

## 2012-03-27 HISTORY — DX: Portal hypertension: K76.6

## 2012-03-27 HISTORY — DX: Presence of other vascular implants and grafts: Z95.828

## 2012-04-01 DIAGNOSIS — E663 Overweight: Secondary | ICD-10-CM | POA: Insufficient documentation

## 2012-04-01 HISTORY — DX: Overweight: E66.3

## 2012-12-30 IMAGING — CR DG CHEST 2V
2 series · 2 of 2 positions shown · non-contrast
Comparison: [DATE].

CLINICAL DATA: Cough.

CHEST - 2 VIEW

[view not recorded (1 of 2)]
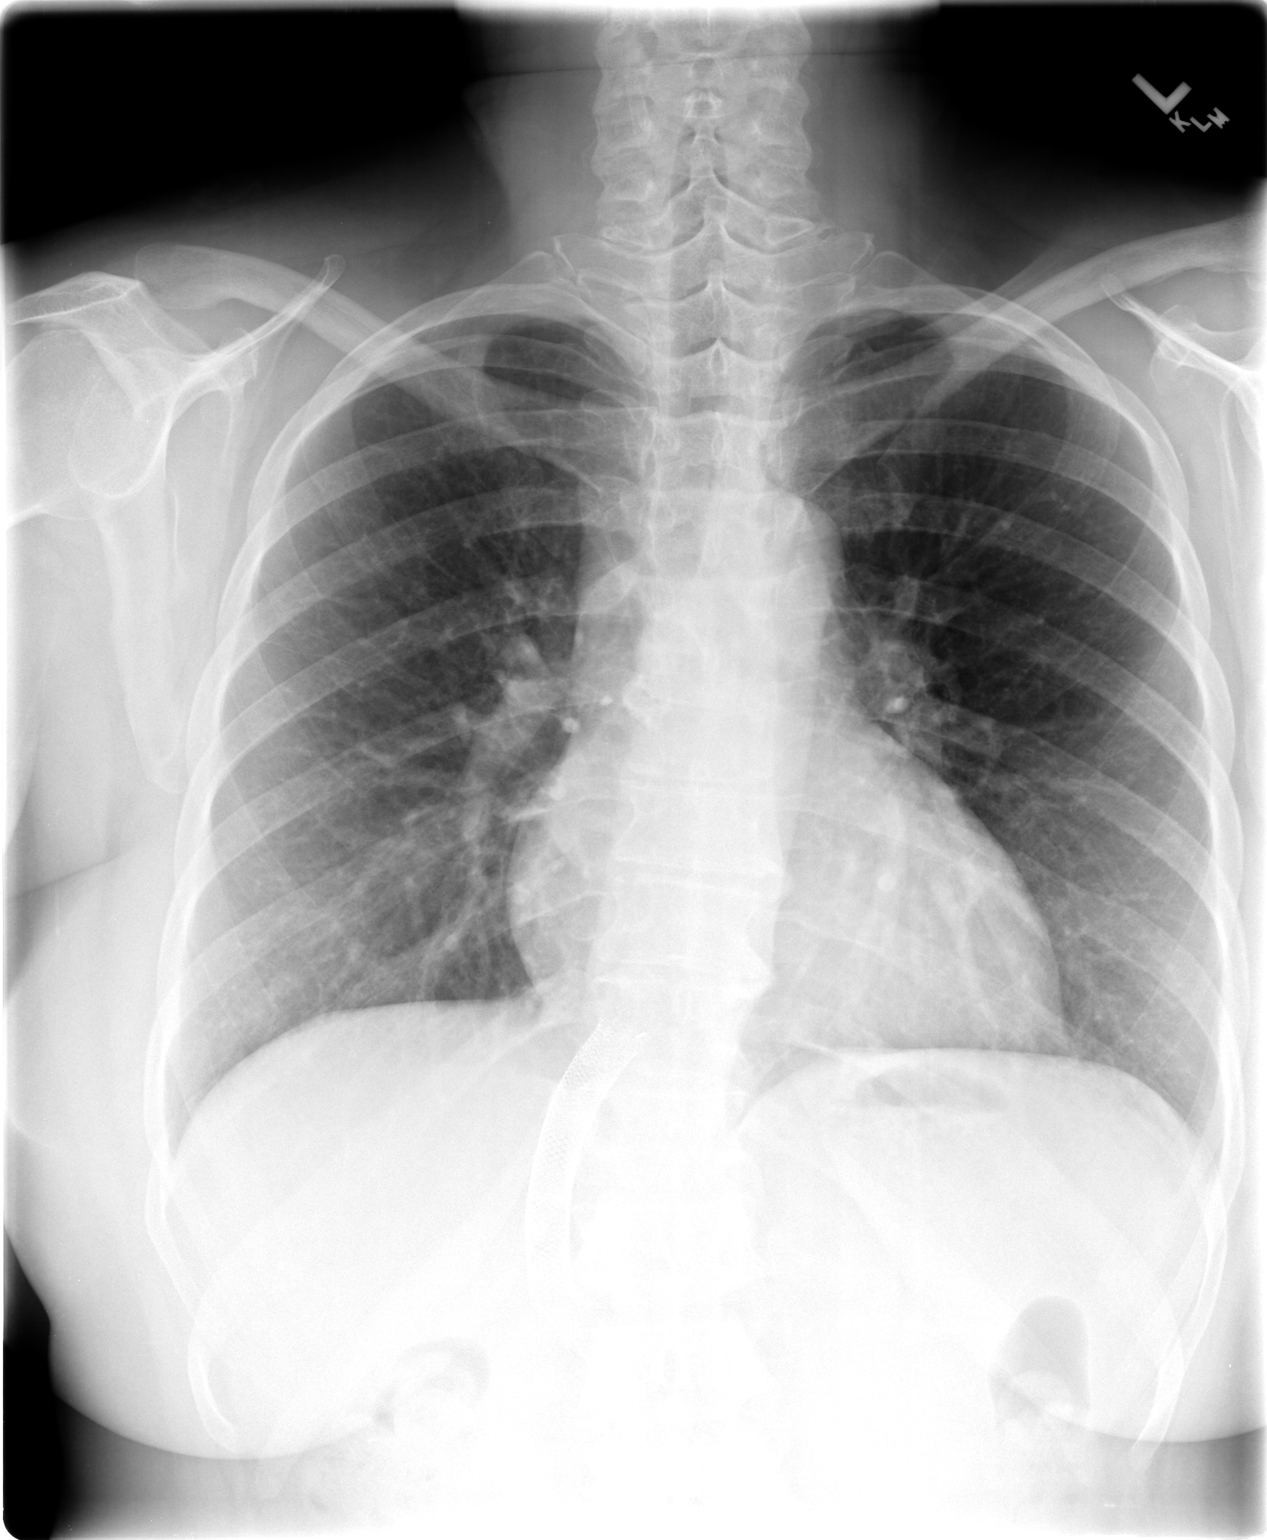

[view not recorded (2 of 2)]
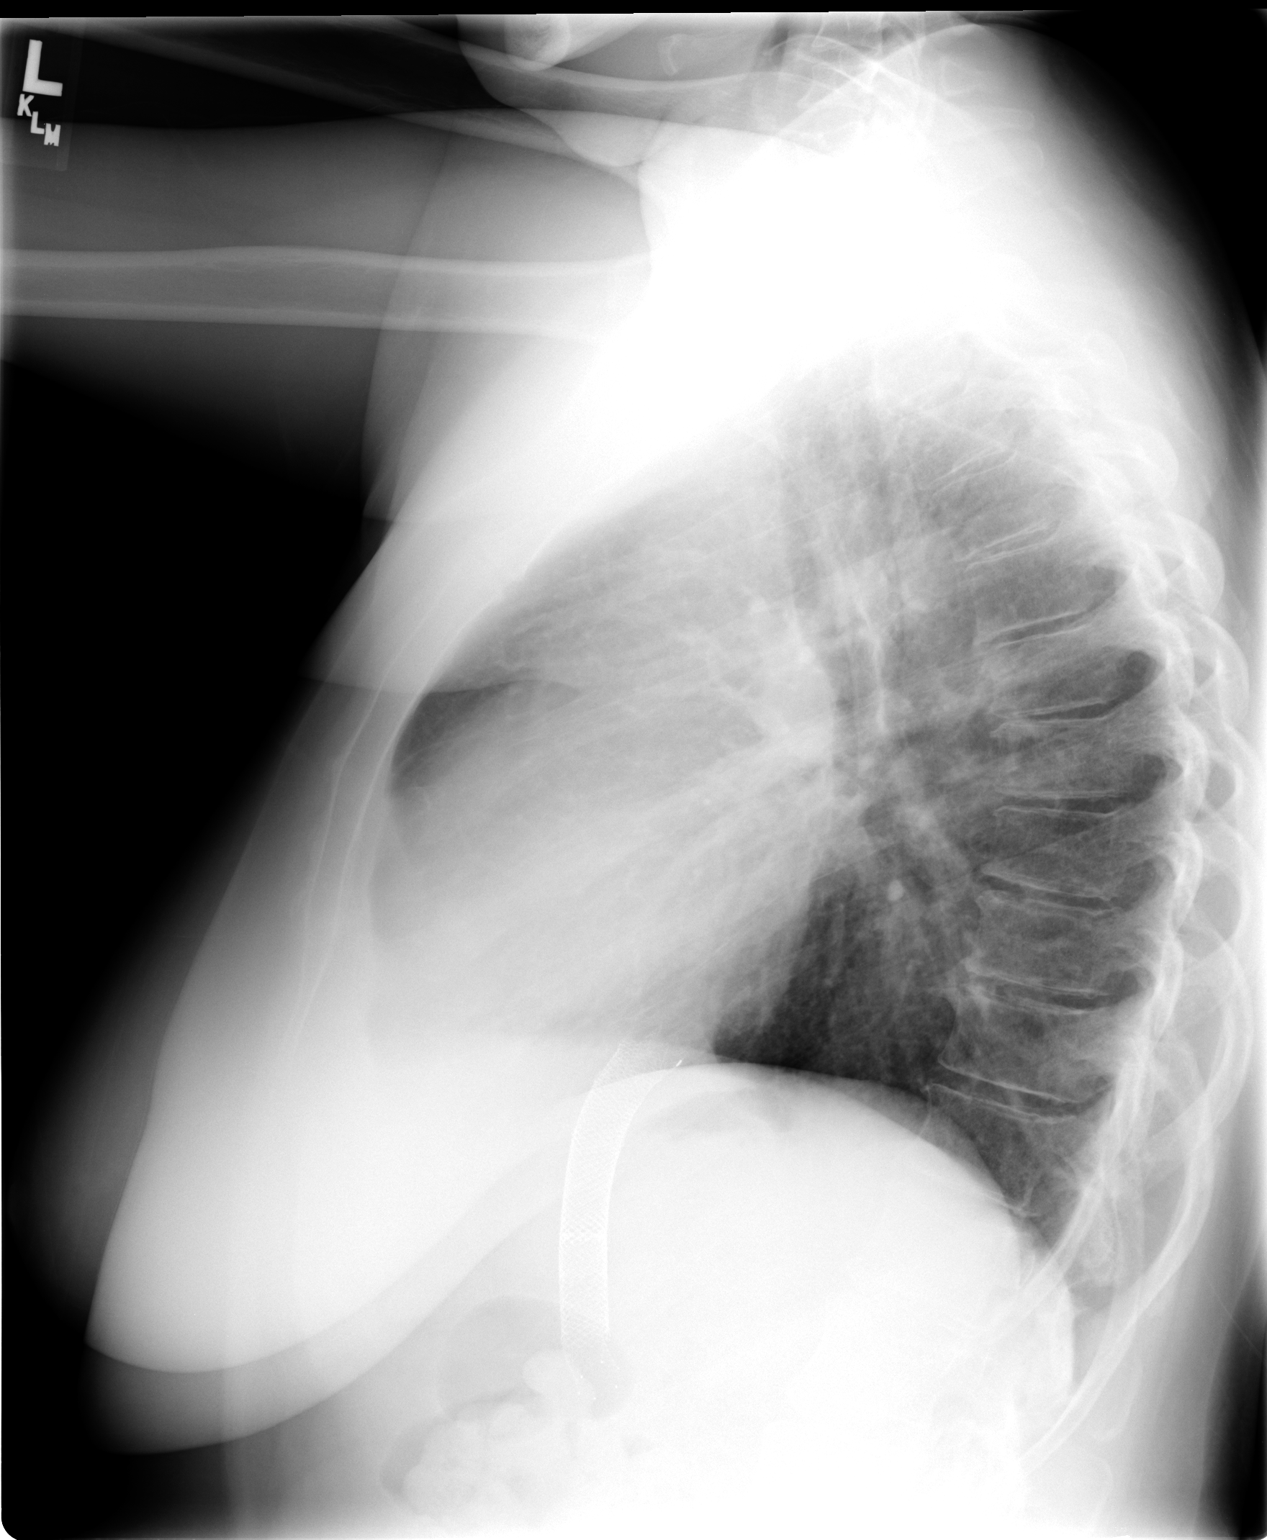

[2 of 2 positions shown; findings below may reference images not displayed]

FINDINGS: The cardiac silhouette, mediastinal and hilar contours
are within normal limits and stable.  The lungs are clear.  No
pleural effusion.  A metallic TIPS stent is noted.
IMPRESSION: No acute cardiopulmonary findings.

## 2013-01-10 ENCOUNTER — Ambulatory Visit
Admission: RE | Admit: 2013-01-10 | Discharge: 2013-01-10 | Disposition: A | Discharge status: Home / Self Care | Payer: Federal, State, Local not specified - PPO | Source: Ambulatory Visit | Attending: Family Medicine | Admitting: Family Medicine

## 2013-01-10 ENCOUNTER — Other Ambulatory Visit: Payer: Self-pay | Admitting: Family Medicine

## 2013-01-10 DIAGNOSIS — R011 Cardiac murmur, unspecified: Secondary | ICD-10-CM

## 2013-01-27 ENCOUNTER — Other Ambulatory Visit (HOSPITAL_COMMUNITY): Payer: Self-pay | Admitting: Family Medicine

## 2013-01-27 DIAGNOSIS — R011 Cardiac murmur, unspecified: Secondary | ICD-10-CM

## 2013-01-30 ENCOUNTER — Ambulatory Visit (HOSPITAL_COMMUNITY): Payer: Federal, State, Local not specified - PPO | Attending: Cardiology

## 2013-01-30 ENCOUNTER — Other Ambulatory Visit (HOSPITAL_COMMUNITY): Payer: Self-pay | Admitting: Family Medicine

## 2013-01-30 DIAGNOSIS — R0989 Other specified symptoms and signs involving the circulatory and respiratory systems: Secondary | ICD-10-CM | POA: Insufficient documentation

## 2013-01-30 DIAGNOSIS — R0609 Other forms of dyspnea: Secondary | ICD-10-CM | POA: Insufficient documentation

## 2013-01-30 DIAGNOSIS — R609 Edema, unspecified: Secondary | ICD-10-CM | POA: Insufficient documentation

## 2013-01-30 DIAGNOSIS — R011 Cardiac murmur, unspecified: Secondary | ICD-10-CM

## 2013-01-30 DIAGNOSIS — K7460 Unspecified cirrhosis of liver: Secondary | ICD-10-CM | POA: Insufficient documentation

## 2013-01-30 DIAGNOSIS — I079 Rheumatic tricuspid valve disease, unspecified: Secondary | ICD-10-CM | POA: Insufficient documentation

## 2013-01-30 NOTE — Progress Notes (Signed)
Echocardiogram performed.  

## 2013-02-24 ENCOUNTER — Other Ambulatory Visit: Payer: Self-pay | Admitting: Gastroenterology

## 2013-02-24 DIAGNOSIS — R109 Unspecified abdominal pain: Secondary | ICD-10-CM

## 2013-02-28 ENCOUNTER — Ambulatory Visit
Admission: RE | Admit: 2013-02-28 | Discharge: 2013-02-28 | Disposition: A | Discharge status: Home / Self Care | Payer: Federal, State, Local not specified - PPO | Source: Ambulatory Visit | Attending: Gastroenterology | Admitting: Gastroenterology

## 2013-02-28 DIAGNOSIS — R109 Unspecified abdominal pain: Secondary | ICD-10-CM

## 2013-04-28 ENCOUNTER — Ambulatory Visit (INDEPENDENT_AMBULATORY_CARE_PROVIDER_SITE_OTHER): Payer: Federal, State, Local not specified - PPO

## 2013-04-28 ENCOUNTER — Other Ambulatory Visit: Payer: Self-pay | Admitting: Podiatry

## 2013-04-28 ENCOUNTER — Ambulatory Visit (INDEPENDENT_AMBULATORY_CARE_PROVIDER_SITE_OTHER): Payer: Federal, State, Local not specified - PPO | Admitting: Podiatry

## 2013-04-28 ENCOUNTER — Encounter: Payer: Self-pay | Admitting: Podiatry

## 2013-04-28 VITALS — BP 115/76 | HR 83 | Resp 18

## 2013-04-28 DIAGNOSIS — R52 Pain, unspecified: Secondary | ICD-10-CM

## 2013-04-28 DIAGNOSIS — M659 Synovitis and tenosynovitis, unspecified: Secondary | ICD-10-CM

## 2013-04-28 NOTE — Patient Instructions (Addendum)
Peroneal Tendinitis with Rehab Wear the boot on the right leg when walking and remove when driving and sleeping.  Tendonitis is inflammation of a tendon. Inflammation of the tendons on the back of the outer ankle (peroneal tendons) is known as peroneal tendonitis. The peroneal tendons are responsible for connecting the muscles that allow you to stand on your "tippy toes" to the bones of the ankle. For this reason, peroneal tendonitis often causes pain when trying to complete such motions. Peroneal tendonitis often involves a tear (strain) of the peroneal tendons. Strains are classified into three categories. Grade 1 strains cause pain, but the tendon is not lengthened. Grade 2 strains include a lengthened ligament, due to the ligament being stretched or partially ruptured. With grade 2 strains there is still function, although function may be decreased. Grade 3 strains involve a complete tear of the tendon or muscle, and function is usually impaired. SYMPTOMS   Pain, tenderness, swelling, warmth, or redness over the back of the outer side of the ankle, the outer part of the mid-foot, or the bottom of the arch.  Pain that gets worse with ankle motion (especially when pushing off or pushing down with the front of the foot), or when standing on the ball of the foot or pushing the foot outward.  Crackling sound (crepitation) when the tendon is moved or touched. CAUSES  Peroneal tendinitis occurs when injury to the peroneal tendons causes the body to respond with inflammation. Common causes of injury include:  An overuse injury, in which the groove behind the outer ankle (where the tendon is located) causes wear on the tendon.  A sudden stress placed on the tendon, such as from an increase in the intensity, frequency, or duration of training.  Direct hit (trauma) to the tendon.  Return to activity too soon after a previous ankle injury. RISK INCREASES WITH:  Sports that require sudden, repetitive  pushing off of the foot, such as jumping or quick starts.  Kicking and running sports, especially running down hills or long distances.  Poor strength and flexibility.  Previous injury to the foot, ankle, or leg. PREVENTION  Warm up and stretch properly before activity.  Allow for adequate recovery between workouts.  Maintain physical fitness:  Strength, flexibility, and endurance.  Cardiovascular fitness.  Complete rehabilitation after previous injury. PROGNOSIS  If treated properly, peroneal tendonitis usually heals within 6 weeks.  RELATED COMPLICATIONS  Longer healing time, if not properly treated or if not given enough time to heal.  Recurring symptoms, if activity is resumed too soon, with overuse, or when using poor technique.  If untreated, tendinitis may result in tendon rupture, requiring surgery. TREATMENT  Treatment first involves the use of ice and medicine, to reduce pain and inflammation. The use of strengthening and stretching exercises may help reduce pain with activity. These exercises may be performed at home or with a therapist. Sometimes, the foot and ankle will be restrained for 10 to 14 days to promote healing. Your caregiver may advise that you place a heel lift in your shoes to reduce the stress placed on the tendon. If non-surgical treatment is unsuccessful, surgery to remove the inflamed tendon lining (sheath) may be advised.  MEDICATION   If pain medicine is needed, nonsteroidal anti-inflammatory medicines (aspirin and ibuprofen), or other minor pain relievers (acetaminophen), are often advised.  Do not take pain medicine for 7 days before surgery.  Prescription pain relievers may be given, if your caregiver thinks they are needed. Use only  as directed and only as much as you need. HEAT AND COLD  Cold treatment (icing) should be applied for 10 to 15 minutes every 2 to 3 hours for inflammation and pain, and immediately after activity that aggravates  your symptoms. Use ice packs or an ice massage.  Heat treatment may be used before performing stretching and strengthening activities prescribed by your caregiver, physical therapist, or athletic trainer. Use a heat pack or a warm water soak. SEEK MEDICAL CARE IF:  Symptoms get worse or do not improve in 2 to 4 weeks, despite treatment.  New, unexplained symptoms develop. (Drugs used in treatment may produce side effects.) EXERCISES RANGE OF MOTION (ROM) AND STRETCHING EXERCISES - Peroneal Tendinitis These exercises may help you when beginning to rehabilitate your injury. Your symptoms may resolve with or without further involvement from your physician, physical therapist or athletic trainer. While completing these exercises, remember:   Restoring tissue flexibility helps normal motion to return to the joints. This allows healthier, less painful movement and activity.  An effective stretch should be held for at least 30 seconds.  A stretch should never be painful. You should only feel a gentle lengthening or release in the stretched tissue. RANGE OF MOTION - Ankle Eversion  Sit with your right / left ankle crossed over your opposite knee.  Grip your foot with your opposite hand, placing your thumb on the top of your foot and your fingers across the bottom of your foot.  Gently push your foot downward with a slight rotation, so your littlest toes rise slightly toward the ceiling.  You should feel a gentle stretch on the inside of your ankle. Hold the stretch for __________ seconds. Repeat __________ times. Complete this exercise __________ times per day.  RANGE OF MOTION - Ankle Inversion  Sit with your right / left ankle crossed over your opposite knee.  Grip your foot with your opposite hand, placing your thumb on the bottom of your foot and your fingers across the top of your foot.  Gently pull your foot so the smallest toe comes toward you and your thumb pushes the inside of the  ball of your foot away from you.  You should feel a gentle stretch on the outside of your ankle. Hold the stretch for __________ seconds. Repeat __________ times. Complete this exercise __________ times per day.  RANGE OF MOTION - Ankle Plantar Flexion  Sit with your right / left leg crossed over your opposite knee.  Use your opposite hand to pull the top of your foot and toes toward you.  You should feel a gentle stretch on the top of your foot and ankle. Hold this position for __________ seconds. Repeat __________ times. Complete __________ times per day.  STRETCH  Gastroc, Standing  Place your hands on a wall.  Extend your right / left leg behind you, keeping the front knee somewhat bent.  Slightly point your toes inward on your back foot.  Keeping your right / left heel on the floor and your knee straight, shift your weight toward the wall, not allowing your back to arch.  You should feel a gentle stretch in the calf. Hold this position for __________ seconds. Repeat __________ times. Complete this stretch __________ times per day. STRETCH  Soleus, Standing  Place your hands on a wall.  Extend your right / left leg behind you, keeping the other knee somewhat bent.  Slightly point your toes inward on your back foot.  Keep your heel on  the floor, bend your back knee, and slightly shift your weight over the back leg so that you feel a gentle stretch deep in your back calf.  Hold this position for __________ seconds. Repeat __________ times. Complete this stretch __________ times per day. STRETCH  Gastrocsoleus, Standing Note: This exercise can place a lot of stress on your foot and ankle. Please complete this exercise only if specifically instructed by your caregiver.   Place the ball of your right / left foot on a step, keeping your other foot firmly on the same step.  Hold on to the wall or a rail for balance.  Slowly lift your other foot, allowing your body weight to  press your heel down over the edge of the step.  You should feel a stretch in your right / left calf.  Hold this position for __________ seconds.  Repeat this exercise with a slight bend in your knee. Repeat __________ times. Complete this stretch __________ times per day.  STRENGTHENING EXERCISES - Peroneal Tendinitis  These exercises may help you when beginning to rehabilitate your injury. They may resolve your symptoms with or without further involvement from your physician, physical therapist or athletic trainer. While completing these exercises, remember:   Muscles can gain both the endurance and the strength needed for everyday activities through controlled exercises.  Complete these exercises as instructed by your physician, physical therapist or athletic trainer. Increase the resistance and repetitions only as guided by your caregiver. STRENGTH - Dorsiflexors  Secure a rubber exercise band or tubing to a fixed object (table, pole) and loop the other end around your right / left foot.  Sit on the floor facing the fixed object. The band should be slightly tense when your foot is relaxed.  Slowly draw your foot back toward you, using your ankle and toes.  Hold this position for __________ seconds. Slowly release the tension in the band and return your foot to the starting position. Repeat __________ times. Complete this exercise __________ times per day.  STRENGTH - Towel Curls  Sit in a chair, on a non-carpeted surface.  Place your foot on a towel, keeping your heel on the floor.  Pull the towel toward your heel only by curling your toes. Keep your heel on the floor.  If instructed by your physician, physical therapist or athletic trainer, add weight to the end of the towel. Repeat __________ times. Complete this exercise __________ times per day. STRENGTH - Ankle Eversion   Secure one end of a rubber exercise band or tubing to a fixed object (table, pole). Loop the other  end around your foot, just before your toes.  Place your fists between your knees. This will focus your strengthening at your ankle.  Drawing the band across your opposite foot, away from the pole, slowly, pull your little toe out and up. Make sure the band is positioned to resist the entire motion.  Hold this position for __________ seconds.  Have your muscles resist the band, as it slowly pulls your foot back to the starting position. Repeat __________ times. Complete this exercise __________ times per day.  Document Released: 04/17/2005 Document Revised: 07/10/2011 Document Reviewed: 07/30/2008 [facility] Patient Information 2014 [place], [place]. foot daily he except when showering, sleeping and driving.

## 2013-04-28 NOTE — Progress Notes (Signed)
° °  Subjective:    Patient ID: Yvonne Chapman, female    DOB: 02-17-1961, 52 y.o.   MRN: [id number]  HPI my right ankle has been hurting for about 2 to 3 months and swells and is purple color and hurts to turn it up and no injury noted and hurts in the morning     Review of Systems  Constitutional: Negative.   HENT: Positive for sinus pressure.   Eyes: Negative.   Respiratory: Negative.   Cardiovascular: Negative.        Calf pain when walking  Gastrointestinal: Positive for constipation.       Bloating   Genitourinary: Positive for urgency and frequency.  Musculoskeletal: Positive for back pain.       Joint and muscle pain and difficulty walking  Skin: Negative.   Allergic/Immunologic: Negative.   Neurological: Negative.   Hematological: Negative.   Psychiatric/Behavioral: Negative.        Objective:   Physical Exam  Vascular: DP and PT pulses are two over four bilaterally  Neurological: Knee and ankle reflexes equal and reactive bilaterally  Dermatological: Texture and turgor within normal limits  Musculoskeletal: Point tenderness over the lateral calcaneal tear care peroneal tubercle right foot. This area seems to duplicate. Discomfort. There is strong eversion 5 over 5 bilaterally.  X-ray evaluation with this symptomatic area marked with a wire did not demonstrate any usual underlying lesions.    Assessment & Plan:   Assessment: Peroneal tendinitis about the.peroneal tubercle right foot Cannot rule out pathology in around the peroneal tubercle.  Plan: Offered patient steroid injection into the area. Patient refused the injection. Patient placed in a Cam Walker boot to wear and right foot except when driving, showering and sleeping. She she was hesitant to wear the Cam Walker boot because of her job which requires multiple changes from a seated to a standing position. I advised her that I thought the boot would be the most effective way to see if this area would improve.  If the symptoms persists with consistent protection I would recommend a MRI to evaluate the tendon and peroneal tubercle in detail. A handwritten note was provided to the to the patient's employer requesting reduce standing walking at work if possible. Reappoint x6 weeks

## 2013-04-29 ENCOUNTER — Encounter: Payer: Self-pay | Admitting: Podiatry

## 2013-06-09 ENCOUNTER — Ambulatory Visit: Payer: Federal, State, Local not specified - PPO | Admitting: Podiatry

## 2013-07-07 ENCOUNTER — Ambulatory Visit: Payer: Federal, State, Local not specified - PPO | Admitting: Podiatry

## 2013-09-26 ENCOUNTER — Other Ambulatory Visit: Payer: Self-pay | Admitting: Family Medicine

## 2013-09-26 ENCOUNTER — Ambulatory Visit
Admission: RE | Admit: 2013-09-26 | Discharge: 2013-09-26 | Disposition: A | Discharge status: Home / Self Care | Payer: Federal, State, Local not specified - PPO | Source: Ambulatory Visit | Attending: Family Medicine | Admitting: Family Medicine

## 2013-09-26 DIAGNOSIS — M549 Dorsalgia, unspecified: Secondary | ICD-10-CM

## 2014-01-13 ENCOUNTER — Encounter (HOSPITAL_BASED_OUTPATIENT_CLINIC_OR_DEPARTMENT_OTHER): Payer: Self-pay | Admitting: Emergency Medicine

## 2014-01-13 ENCOUNTER — Emergency Department (HOSPITAL_BASED_OUTPATIENT_CLINIC_OR_DEPARTMENT_OTHER): Payer: Federal, State, Local not specified - PPO

## 2014-01-13 ENCOUNTER — Emergency Department (HOSPITAL_BASED_OUTPATIENT_CLINIC_OR_DEPARTMENT_OTHER)
Admission: EM | Admit: 2014-01-13 | Discharge: 2014-01-13 | Disposition: A | Discharge status: Home / Self Care | Payer: Federal, State, Local not specified - PPO | Attending: Emergency Medicine | Admitting: Emergency Medicine

## 2014-01-13 DIAGNOSIS — Z862 Personal history of diseases of the blood and blood-forming organs and certain disorders involving the immune mechanism: Secondary | ICD-10-CM | POA: Insufficient documentation

## 2014-01-13 DIAGNOSIS — Z8742 Personal history of other diseases of the female genital tract: Secondary | ICD-10-CM | POA: Insufficient documentation

## 2014-01-13 DIAGNOSIS — Z792 Long term (current) use of antibiotics: Secondary | ICD-10-CM | POA: Diagnosis not present

## 2014-01-13 DIAGNOSIS — Z791 Long term (current) use of non-steroidal anti-inflammatories (NSAID): Secondary | ICD-10-CM | POA: Diagnosis not present

## 2014-01-13 DIAGNOSIS — Z8619 Personal history of other infectious and parasitic diseases: Secondary | ICD-10-CM | POA: Diagnosis not present

## 2014-01-13 DIAGNOSIS — M7989 Other specified soft tissue disorders: Secondary | ICD-10-CM

## 2014-01-13 DIAGNOSIS — Z79899 Other long term (current) drug therapy: Secondary | ICD-10-CM | POA: Diagnosis not present

## 2014-01-13 DIAGNOSIS — Z8701 Personal history of pneumonia (recurrent): Secondary | ICD-10-CM | POA: Diagnosis not present

## 2014-01-13 DIAGNOSIS — R0602 Shortness of breath: Secondary | ICD-10-CM | POA: Insufficient documentation

## 2014-01-13 LAB — CBC
HCT: 37.3 % (ref 36.0–46.0)
Hemoglobin: 12.5 g/dL (ref 12.0–15.0)
MCH: 27.4 pg (ref 26.0–34.0)
MCHC: 33.5 g/dL (ref 30.0–36.0)
MCV: 81.6 fL (ref 78.0–100.0)
PLATELETS: 174 10*3/uL (ref 150–400)
RBC: 4.57 MIL/uL (ref 3.87–5.11)
RDW: 13.3 % (ref 11.5–15.5)
WBC: 4.2 10*3/uL (ref 4.0–10.5)

## 2014-01-13 LAB — BASIC METABOLIC PANEL
ANION GAP: 13 (ref 5–15)
BUN: 10 mg/dL (ref 6–23)
CHLORIDE: 104 meq/L (ref 96–112)
CO2: 26 mEq/L (ref 19–32)
Calcium: 9.2 mg/dL (ref 8.4–10.5)
Creatinine, Ser: 0.6 mg/dL (ref 0.50–1.10)
GFR calc non Af Amer: >90 mL/min (ref >90)
Glucose, Bld: 86 mg/dL (ref 70–99)
Potassium: 3.3 mEq/L — ABNORMAL LOW (ref 3.7–5.3)
SODIUM: 143 meq/L (ref 137–147)

## 2014-01-13 LAB — CK: CK TOTAL: 132 U/L (ref 7–177)

## 2014-01-13 NOTE — ED Notes (Signed)
Swelling in her right upper arm x 2 weeks. Saw her MD today and wanted her to come here.

## 2014-01-13 NOTE — ED Provider Notes (Signed)
CSN: [id number]     Arrival date & time 01/13/14  9735 History  This chart was scribed for Threasa Beards, MD by Peyton Bottoms, ED Scribe. This patient was seen in room MH04/MH04 and the patient's care was started at 8:37 PM.  Chief Complaint  Patient presents with  . Arm Swelling   Patient is a 53 y.o. female presenting with extremity pain. The history is provided by the patient. No language interpreter was used.  Extremity Pain This is a new problem. The current episode started more than 1 week ago. The problem occurs constantly. The problem has not changed since onset.Associated symptoms include shortness of breath. Pertinent negatives include no chest pain, no abdominal pain and no headaches. Nothing aggravates the symptoms. Nothing relieves the symptoms. Treatments tried: Naproxen.    HPI Comments: Yvonne Chapman is a 53 y.o. female with a history of sarcoidosis and pneumonia, who presents to the Emergency Department complaining of right upper arm pain that began 2 weeks ago and swelling that began 2 days ago. Patient states that she saw her MD today who told her to come to the ER due to concern for blood clot. Patient states that she has no history of embolisms. Patient denies associated chest pain. Patient states that she has felt mild shortness of breath but states that it may be due to recent moisture/mold issues at her house. She states that she took Naproxen  last night with no relief.  Past Medical History  Diagnosis Date  . Pneumonia   . Sarcoidosis   . Vaginitis   . Dysmenorrhea   . Fibroids   . Bacterial vaginosis   . History of chicken pox   . Yeast infection   . Trichomonas   . Gonorrhea   . Herpes   . Monilia infection   . Endometriosis   . Abnormal uterine bleeding   . Anemia   . Ovarian cyst    Past Surgical History  Procedure Laterality Date  . Wisdom tooth extraction    . Foot surgery    . Dilation and curettage of uterus    . Laparoscopic ovarian  cystectomy     Family History  Problem Relation Age of Onset  . Hypertension Mother   . Hypertension Father    History  Substance Use Topics  . Smoking status: Never Smoker   . Smokeless tobacco: Not on file  . Alcohol Use: No   OB History   Grav Para Term Preterm Abortions TAB SAB Ect Mult Living   2 0             Review of Systems  Respiratory: Positive for shortness of breath.   Cardiovascular: Negative for chest pain.  Gastrointestinal: Negative for abdominal pain.  Musculoskeletal:       Swelling and pain to right upper arm.  Neurological: Negative for headaches.  All other systems reviewed and are negative.  Allergies  Floxin; Metronidazole; and Sulfur  Home Medications   Prior to Admission medications   Medication Sig Start Date End Date Taking? Authorizing Provider  Fexofenadine HCl (ALLEGRA PO) Take by mouth.   Yes Historical Provider, MD  acetaminophen (TYLENOL) 500 MG tablet Take 500 mg by mouth every 6 (six) hours as needed. pain    Historical Provider, MD  amoxicillin-clavulanate (AUGMENTIN) 875-125 MG per tablet Take 1 tablet by mouth 2 (two) times daily.    Historical Provider, MD  benzonatate (TESSALON) 100 MG capsule  04/07/13   Historical Provider, MD  furosemide (LASIX) 20 MG tablet  02/04/13   Historical Provider, MD  HYDROcodone-homatropine [facility]) 5-1.5 MG/5ML syrup  04/07/13   Historical Provider, MD  levofloxacin (LEVAQUIN) 500 MG tablet  04/07/13   Historical Provider, MD  Multiple Vitamin (MULITIVITAMIN WITH MINERALS) TABS Take 1 tablet by mouth daily.    Historical Provider, MD  naproxen sodium (ANAPROX) 550 MG tablet  04/07/13   Historical Provider, MD   Triage Vitals: BP 146/79  Pulse 68  Temp(Src) 97.8 F (36.6 C) (Oral)  Resp 18  Ht 5\' 7"  (1.702 m)  Wt 217 lb (98.431 kg)  BMI 33.98 kg/m2  SpO2 100%  Physical Exam  Nursing note and vitals reviewed. Constitutional: She is oriented to person, place, and time. She appears well-developed  and well-nourished. No distress.  HENT:  Head: Normocephalic and atraumatic.  Eyes: Conjunctivae and EOM are normal.  Neck: Neck supple. No tracheal deviation present.  Cardiovascular: Normal rate.   Pulmonary/Chest: Effort normal. No respiratory distress.  Musculoskeletal: Normal range of motion. She exhibits edema.  Right upper extremity with mild swelling, medial surface to the upper arm. Compartment soft. No discoloration of extremity.  Neurological: She is alert and oriented to person, place, and time.  Skin: Skin is warm and dry.  Psychiatric: She has a normal mood and affect. Her behavior is normal.  Note- 2+ radial pulses bilaterally  ED Course  Procedures (including critical care time)  DIAGNOSTIC STUDIES: Oxygen Saturation is 100% on RA, normal by my interpretation.    COORDINATION OF CARE: 8:45 PM- Discussed plans to order diagnostic lab work, EKG and imaging. Pt advised of plan for treatment and pt agrees.  Labs Review Labs Reviewed  BASIC METABOLIC PANEL - Abnormal; Notable for the following:    Potassium 3.3 (*)    All other components within normal limits  CBC  CK    Imaging Review US Venous Img Upper Uni Right  01/13/2014   CLINICAL DATA:  Right arm swelling. Pain, edema. Numbness in the fingers.  EXAM: RIGHT UPPER EXTREMITY VENOUS DOPPLER ULTRASOUND  TECHNIQUE: Gray-scale sonography with graded compression, as well as color Doppler and duplex ultrasound were performed to evaluate the upper extremity deep venous system from the level of the subclavian vein and including the jugular, axillary, basilic, radial, ulnar and upper cephalic vein. Spectral Doppler was utilized to evaluate flow at rest and with distal augmentation maneuvers.  COMPARISON:  None.  FINDINGS: Internal Jugular Vein: No evidence of thrombus. Normal compressibility, respiratory phasicity and response to augmentation.  Subclavian Vein: No evidence of thrombus. Normal compressibility, respiratory  phasicity and response to augmentation.  Axillary Vein: No evidence of thrombus. Normal compressibility, respiratory phasicity and response to augmentation.  Basilic Vein: No evidence of thrombus. Normal compressibility, respiratory phasicity and response to augmentation.  Brachial Veins: No evidence of thrombus. Normal compressibility, respiratory phasicity and response to augmentation.  Radial Veins: Difficulty to image.  Ulnar Veins: No evidence of thrombus. Normal compressibility, respiratory phasicity and response to augmentation.  IMPRESSION: No evidence of deep venous thrombosis.   Electronically Signed   By: Shon Hale M.D.   On: 01/13/2014 23:08     EKG Interpretation   Date/Time:  Tuesday January 13 2014 19:12:02 EDT Ventricular Rate:  67 PR Interval:  154 QRS Duration: 84 QT Interval:  436 QTC Calculation: 460 R Axis:   30 Text Interpretation:  Normal sinus rhythm Nonspecific T wave abnormality  borderline prolonged QT Abnormal ECG No significant change since last  tracing  Confirmed by Canary Brim  MD, [place] [phone]) on 01/13/2014 7:19:13 PM     MDM   Final diagnoses:  Swelling of right upper extremity    Pt presenting with swelling of right upper arm- no signs of compartment syndrome, dvt study negative for DVT.  CK not elevated, pt afebrile and no elevation in WBC to suggest infectious cause- no cellulities present.  Pt advised of these results, advised ice/elevation of arm and followup with PMD.  Discharged with strict return precautions.  Pt agreeable with plan.  Nursing notes including past medical history and social history reviewed and considered in documentation  Prior records reviewed and considered during this visit    I personally performed the services described in this documentation, which was scribed in my presence. The recorded information has been reviewed and is accurate.   Threasa Beards, MD 01/13/14 [phone]

## 2014-01-13 NOTE — ED Notes (Signed)
Pt discharged to home NAD.  

## 2014-01-13 NOTE — Discharge Instructions (Signed)
Return to the ED with any concerns including increased swelling/numbness/discoloration of upper extremity, chest pain, difficulty breathing, or any other alarming symptoms

## 2014-03-02 ENCOUNTER — Encounter (HOSPITAL_BASED_OUTPATIENT_CLINIC_OR_DEPARTMENT_OTHER): Payer: Self-pay | Admitting: Emergency Medicine

## 2014-12-21 ENCOUNTER — Ambulatory Visit
Admission: RE | Admit: 2014-12-21 | Discharge: 2014-12-21 | Disposition: A | Discharge status: Home / Self Care | Payer: Federal, State, Local not specified - PPO | Source: Ambulatory Visit | Attending: Family Medicine | Admitting: Family Medicine

## 2014-12-21 ENCOUNTER — Other Ambulatory Visit: Payer: Self-pay | Admitting: Family Medicine

## 2014-12-21 DIAGNOSIS — R059 Cough, unspecified: Secondary | ICD-10-CM

## 2014-12-21 DIAGNOSIS — R05 Cough: Secondary | ICD-10-CM

## 2015-06-02 ENCOUNTER — Ambulatory Visit
Admission: RE | Admit: 2015-06-02 | Discharge: 2015-06-02 | Disposition: A | Discharge status: Home / Self Care | Payer: Federal, State, Local not specified - PPO | Source: Ambulatory Visit | Attending: Family Medicine | Admitting: Family Medicine

## 2015-06-02 ENCOUNTER — Other Ambulatory Visit: Payer: Self-pay | Admitting: Family Medicine

## 2015-06-02 DIAGNOSIS — M25561 Pain in right knee: Secondary | ICD-10-CM

## 2015-08-27 DIAGNOSIS — E559 Vitamin D deficiency, unspecified: Secondary | ICD-10-CM | POA: Diagnosis not present

## 2015-09-01 DIAGNOSIS — D869 Sarcoidosis, unspecified: Secondary | ICD-10-CM | POA: Diagnosis not present

## 2015-09-01 DIAGNOSIS — K5900 Constipation, unspecified: Secondary | ICD-10-CM | POA: Diagnosis not present

## 2015-09-01 DIAGNOSIS — Z95828 Presence of other vascular implants and grafts: Secondary | ICD-10-CM | POA: Diagnosis not present

## 2015-09-01 DIAGNOSIS — R12 Heartburn: Secondary | ICD-10-CM | POA: Diagnosis not present

## 2015-09-01 DIAGNOSIS — K7469 Other cirrhosis of liver: Secondary | ICD-10-CM | POA: Diagnosis not present

## 2015-09-08 DIAGNOSIS — Z1231 Encounter for screening mammogram for malignant neoplasm of breast: Secondary | ICD-10-CM | POA: Diagnosis not present

## 2015-11-05 DIAGNOSIS — K130 Diseases of lips: Secondary | ICD-10-CM | POA: Diagnosis not present

## 2016-01-10 DIAGNOSIS — M791 Myalgia: Secondary | ICD-10-CM | POA: Diagnosis not present

## 2016-01-10 DIAGNOSIS — E559 Vitamin D deficiency, unspecified: Secondary | ICD-10-CM | POA: Diagnosis not present

## 2016-01-10 DIAGNOSIS — R5383 Other fatigue: Secondary | ICD-10-CM | POA: Diagnosis not present

## 2016-01-26 DIAGNOSIS — E669 Obesity, unspecified: Secondary | ICD-10-CM | POA: Diagnosis not present

## 2016-01-26 DIAGNOSIS — Z01419 Encounter for gynecological examination (general) (routine) without abnormal findings: Secondary | ICD-10-CM | POA: Diagnosis not present

## 2016-03-09 ENCOUNTER — Ambulatory Visit: Payer: Federal, State, Local not specified - PPO | Admitting: Dietician

## 2016-03-17 DIAGNOSIS — K7469 Other cirrhosis of liver: Secondary | ICD-10-CM | POA: Diagnosis not present

## 2016-03-17 DIAGNOSIS — K7460 Unspecified cirrhosis of liver: Secondary | ICD-10-CM | POA: Diagnosis not present

## 2016-03-17 DIAGNOSIS — B356 Tinea cruris: Secondary | ICD-10-CM | POA: Diagnosis not present

## 2016-03-17 DIAGNOSIS — D869 Sarcoidosis, unspecified: Secondary | ICD-10-CM | POA: Diagnosis not present

## 2016-03-17 DIAGNOSIS — Z95828 Presence of other vascular implants and grafts: Secondary | ICD-10-CM | POA: Diagnosis not present

## 2016-03-17 DIAGNOSIS — R161 Splenomegaly, not elsewhere classified: Secondary | ICD-10-CM | POA: Diagnosis not present

## 2016-03-27 DIAGNOSIS — J069 Acute upper respiratory infection, unspecified: Secondary | ICD-10-CM | POA: Diagnosis not present

## 2016-03-27 DIAGNOSIS — B9789 Other viral agents as the cause of diseases classified elsewhere: Secondary | ICD-10-CM | POA: Diagnosis not present

## 2016-03-27 DIAGNOSIS — M791 Myalgia: Secondary | ICD-10-CM | POA: Diagnosis not present

## 2016-04-06 ENCOUNTER — Encounter: Payer: Federal, State, Local not specified - PPO | Attending: Obstetrics and Gynecology | Admitting: Dietician

## 2016-04-06 DIAGNOSIS — E669 Obesity, unspecified: Secondary | ICD-10-CM | POA: Diagnosis not present

## 2016-04-06 DIAGNOSIS — Z6836 Body mass index (BMI) 36.0-36.9, adult: Secondary | ICD-10-CM | POA: Insufficient documentation

## 2016-04-06 DIAGNOSIS — Z713 Dietary counseling and surveillance: Secondary | ICD-10-CM | POA: Diagnosis not present

## 2016-04-06 NOTE — Patient Instructions (Addendum)
Try to plan meals/shop on weekends when you have time. Plan to eat 3 x day (protein and carbs).  Breakfast options smoothie - 1 cup and 1.5 cups of fruit, whey protein, almond milk, oatmeal with spoonful of brown sugar and peanut butter, or small bowl of cereal with peanut butter.  Aim to fill half of your plate with vegetables at lunch and dinner. Get frozen vegetables to have on hand or prepare vegetables ahead of time for salad. Get an cookbook. Eating Well and Cooking Light are good online recipe sources.  Drink mostly water. Limit sweet tea to 1 glass every other day (taper off). Get to half/half, then unsweet. Continue to watch portions of restaurants meals.  Limit restaurant meals to no more than 3 meals per week. Start doing some hand weights/squats 2 x week.

## 2016-04-06 NOTE — Progress Notes (Signed)
  Medical Nutrition Therapy:  Appt start time: [id number] end time:  1800.   Assessment:  Primary concerns today: Yvonne Chapman is here today since she is overweight and has liver disease. Feels like she eats too many sweets and drinks sweet tea. Feels likes she is 35-45 lbs overweight. Liver doctor would like her to lose the weight. Has gained this weight in the past 9 years  Has a stressful job and they eat a lot of candy while working. Works at [employer] day hours. Lives by herself. Breakfast is her favorite meal. Does not like to cook much and gets bored eating the same things.  Get 90% of meals from restaurants. Tries to not eat after 6 or 6:30.   Does not like to eat leftovers for multiple days. Would like to cook enough to eats for 2 days.   Preferred Learning Style:   No preference indicated   Learning Readiness:   Ready   MEDICATIONS: see list    DIETARY INTAKE:  Usual eating pattern includes 2-3 meals and 0-1 snacks per day.  Avoided foods include: cucumbers, green peppers, lactose intolerant    24-hr recall:  B ( AM): McDonald's oatmeal or sausage and egg McMuffin or cracker barrell bacon and eggs with biscuit or leftover lunch Snk ( AM): none or apple or mini snickers L ( PM): none or 1/2 [place] flatbread sandwich from restaurant or boneless chicken with side salad or pizza with salad or subway tuna or cold cut 6 inch with chips and drink Snk ( PM): none or protein bar, candy , popcorn  D ( PM): none or restaurant meal - tropical smoothie or sandwich or cracker barrell meat and 2 vegetables or grilled chicken from cook out Snk ( PM): none Beverages: sweet tea, water, mountain dew  Usual physical activity: none  Estimated energy needs: 1600 calories 180 g carbohydrates 120 g protein 44 g fat  Progress Towards Goal(s):  In progress.   Nutritional Diagnosis:  [place]-3.3 Overweight/obesity As related to hx of frequent restaurant meals.  As evidenced by BMI of 36.5.     Intervention:  Nutrition counseling provided. Plan: Try to plan meals/shop on weekends when you have time. Plan to eat 3 x day (protein and carbs).  Breakfast options smoothie - 1 cup and 1.5 cups of fruit, whey protein, almond milk, oatmeal with spoonful of brown sugar and peanut butter, or small bowl of cereal with peanut butter.  Aim to fill half of your plate with vegetables at lunch and dinner. Get frozen vegetables to have on hand or prepare vegetables ahead of time for salad. Get an cookbook. Eating Well and Cooking Light are good online recipe sources.  Drink mostly water. Limit sweet tea to 1 glass every other day (taper off). Get to half/half, then unsweet. Continue to watch portions of restaurants meals.  Limit restaurant meals to no more than 3 meals per week. Start doing some hand weights/squats 2 x week.   Teaching Method Utilized:  Visual Auditory Hands on  Handouts given during visit include:  MyPlate Handout  15 g CHO Snacks  Barriers to learning/adherence to lifestyle change: none  Demonstrated degree of understanding via:  Teach Back   Monitoring/Evaluation:  Dietary intake, exercise, and body weight in 1 month(s).

## 2016-05-31 DIAGNOSIS — F4320 Adjustment disorder, unspecified: Secondary | ICD-10-CM | POA: Diagnosis not present

## 2016-06-07 DIAGNOSIS — F4320 Adjustment disorder, unspecified: Secondary | ICD-10-CM | POA: Diagnosis not present

## 2016-06-12 DIAGNOSIS — R509 Fever, unspecified: Secondary | ICD-10-CM | POA: Diagnosis not present

## 2016-06-12 DIAGNOSIS — J069 Acute upper respiratory infection, unspecified: Secondary | ICD-10-CM | POA: Diagnosis not present

## 2016-06-12 DIAGNOSIS — R05 Cough: Secondary | ICD-10-CM | POA: Diagnosis not present

## 2016-07-05 DIAGNOSIS — Z008 Encounter for other general examination: Secondary | ICD-10-CM | POA: Diagnosis not present

## 2016-07-06 DIAGNOSIS — Z0000 Encounter for general adult medical examination without abnormal findings: Secondary | ICD-10-CM | POA: Diagnosis not present

## 2016-07-06 DIAGNOSIS — E7800 Pure hypercholesterolemia, unspecified: Secondary | ICD-10-CM | POA: Diagnosis not present

## 2016-07-06 DIAGNOSIS — E559 Vitamin D deficiency, unspecified: Secondary | ICD-10-CM | POA: Diagnosis not present

## 2016-07-18 ENCOUNTER — Encounter: Payer: Self-pay | Admitting: Podiatry

## 2016-07-18 ENCOUNTER — Ambulatory Visit (INDEPENDENT_AMBULATORY_CARE_PROVIDER_SITE_OTHER): Payer: Federal, State, Local not specified - PPO | Admitting: Podiatry

## 2016-07-18 VITALS — BP 160/90 | HR 79 | Ht 67.0 in | Wt 235.0 lb

## 2016-07-18 DIAGNOSIS — M216X2 Other acquired deformities of left foot: Secondary | ICD-10-CM | POA: Diagnosis not present

## 2016-07-18 DIAGNOSIS — M722 Plantar fascial fibromatosis: Secondary | ICD-10-CM | POA: Diagnosis not present

## 2016-07-18 DIAGNOSIS — M216X1 Other acquired deformities of right foot: Secondary | ICD-10-CM

## 2016-07-18 DIAGNOSIS — M21969 Unspecified acquired deformity of unspecified lower leg: Secondary | ICD-10-CM

## 2016-07-18 NOTE — Patient Instructions (Signed)
Seen for painful heel left. Noted of weak first metatarsal bone with bunion on left. Noted of tight Achilles tendon on both. Need daily stretch exercise. May benefit from Cortisone injection. May benefit from Custom orthotics.

## 2016-07-18 NOTE — Progress Notes (Signed)
SUBJECTIVE: 56 y.o. year old female presents complaining of pain in left heel plantar duration of 4 weeks.  Sits at work and does not do exercise.  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  OBJECTIVE: DERMATOLOGIC EXAMINATION: Normal findings.  VASCULAR EXAMINATION OF LOWER LIMBS: All pedal pulses are palpable with normal pulsation.  Capillary Filling times within 3 seconds in all digits.  No edema or erythema noted. Temperature gradient from tibial crest to dorsum of foot is within normal bilateral.  NEUROLOGIC EXAMINATION OF THE LOWER LIMBS: All epicritic and tactile sensations grossly intact. Sharp and Dull discriminatory sensations at the plantar ball of hallux is intact bilateral.   MUSCULOSKELETAL EXAMINATION: Positive for Hallux valgus with bunion deformity bilateral. Elevated first ray bilateral. Tight Achilles tendon bilateral.  ASSESSMENT: Planar fasciitis left. Metatarsus primus elevatus bilateral. Ankle equinus left.   PLAN: Reviewed clinical findings and available treatment options, change shoe gear, activity, orthotics, injection. Instructed daily stretch exercise for tight Achilles tendon on both feet. May benefit from Cortisone injection. May benefit from Custom orthotics. Return for Custom orthotics.

## 2016-07-20 ENCOUNTER — Encounter (INDEPENDENT_AMBULATORY_CARE_PROVIDER_SITE_OTHER): Payer: Federal, State, Local not specified - PPO | Admitting: Family Medicine

## 2016-07-21 ENCOUNTER — Ambulatory Visit: Payer: Federal, State, Local not specified - PPO | Admitting: Podiatry

## 2016-07-25 ENCOUNTER — Ambulatory Visit (INDEPENDENT_AMBULATORY_CARE_PROVIDER_SITE_OTHER): Payer: Federal, State, Local not specified - PPO | Admitting: Family Medicine

## 2016-07-25 ENCOUNTER — Encounter (INDEPENDENT_AMBULATORY_CARE_PROVIDER_SITE_OTHER): Payer: Self-pay | Admitting: Family Medicine

## 2016-07-25 VITALS — BP 130/81 | HR 71 | Temp 98.6°F | Resp 18 | Ht 67.0 in | Wt 232.0 lb

## 2016-07-25 DIAGNOSIS — E669 Obesity, unspecified: Secondary | ICD-10-CM

## 2016-07-25 DIAGNOSIS — E559 Vitamin D deficiency, unspecified: Secondary | ICD-10-CM

## 2016-07-25 DIAGNOSIS — R5383 Other fatigue: Secondary | ICD-10-CM

## 2016-07-25 DIAGNOSIS — R0602 Shortness of breath: Secondary | ICD-10-CM

## 2016-07-25 DIAGNOSIS — E784 Other hyperlipidemia: Secondary | ICD-10-CM

## 2016-07-25 DIAGNOSIS — Z1389 Encounter for screening for other disorder: Secondary | ICD-10-CM | POA: Diagnosis not present

## 2016-07-25 DIAGNOSIS — E7849 Other hyperlipidemia: Secondary | ICD-10-CM

## 2016-07-25 DIAGNOSIS — Z6836 Body mass index (BMI) 36.0-36.9, adult: Secondary | ICD-10-CM | POA: Diagnosis not present

## 2016-07-25 DIAGNOSIS — K7469 Other cirrhosis of liver: Secondary | ICD-10-CM | POA: Diagnosis not present

## 2016-07-25 DIAGNOSIS — Z0289 Encounter for other administrative examinations: Secondary | ICD-10-CM

## 2016-07-25 DIAGNOSIS — Z1331 Encounter for screening for depression: Secondary | ICD-10-CM

## 2016-07-25 NOTE — Progress Notes (Signed)
Office: [phone]  /  Fax: [phone]   HPI:   Chief Complaint: OBESITY  Yvonne Chapman (MR# [id number]) is a 56 y.o. female who presents on 07/25/2016 for obesity evaluation and treatment. Current BMI is Body mass index is 36.34 kg/m.Marland Kitchen Yvonne Chapman has struggled with obesity for years and has been unsuccessful in either losing weight or maintaining long term weight loss. Yvonne Chapman attended our information session and states she is currently in the action stage of change and ready to dedicate time achieving and maintaining a healthier weight.  Levana states her desired weight loss is 55 lbs she started gaining weight working at [employer] her heaviest weight ever was 235 lbs. she is a picky eater and doesn't like to eat healthier foods  she has significant food cravings issues  she skips meals frequently she is frequently drinking liquids with calories she frequently makes poor food choices she frequently eats larger portions than normal  she has binge eating behaviors she struggles with emotional eating    Fatigue Yvonne Chapman feels her energy is lower than it should be. This has worsened with weight gain and has not worsened recently. Yvonne Chapman admits to daytime somnolence and  admits to waking up still tired. Patient is at risk for obstructive sleep apnea. Patent has a history of symptoms of daytime fatigue and morning headache. Patient generally gets 6 or 7 hours of sleep per night, and states they generally have restless sleep. Snoring is present. Apneic episodes are not present. Epworth Sleepiness Score is 3  Dyspnea on exertion Yvonne Chapman notes increasing shortness of breath with exercising and seems to be worsening over time with weight gain. She notes getting out of breath sooner with activity than she used to. This has not gotten worse recently. Yvonne Chapman denies orthopnea.  Hyperlipidemia Lamari has hyperlipidemia, LDL >100 earlier this month, not on Statin. Yvonne Chapman would like to work on improving with  diet and has been trying to improve her cholesterol levels with intensive lifestyle modification including a low saturated fat diet, exercise and weight loss. She denies any chest pain, claudication or myalgias. 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  Vitamin D deficiency Yvonne Chapman has a diagnosis of vitamin D deficiency. She is currently taking OTC 5,000 IU vit D daily and level done earlier this month at her PCP was at 40 which is good but not yet at my goal. She denies nausea, vomiting or muscle weakness.  Other Cirrhosis of Liver Yvonne Chapman is status post TIPS and states she had been on the liver transplant list but is doing so well she was removed from the list. She has been told she has fatty liver as well.  Depression Screen Yvonne Chapman's Food and Mood (modified PHQ-9) score was  Depression screen PHQ 2/9 07/25/2016  Decreased Interest 2  Down, Depressed, Hopeless 2  PHQ - 2 Score 4  Altered sleeping 3  Tired, decreased energy 3  Change in appetite 3  Feeling bad or failure about yourself  1  Trouble concentrating 1  Moving slowly or fidgety/restless 0  Suicidal thoughts 0  PHQ-9 Score 15    ALLERGIES: Allergies  Allergen Reactions  . Floxin [Ofloxacin]   . Metronidazole Nausea Only  . Sulfur Nausea Only    MEDICATIONS: Current Outpatient Prescriptions on File Prior to Visit  Medication Sig Dispense Refill  . Cholecalciferol (VITAMIN D PO) Take by mouth.    . Fexofenadine HCl (ALLEGRA PO) Take by mouth.    Marland Kitchen  Multiple Vitamin (MULITIVITAMIN WITH MINERALS) TABS Take 1 tablet by mouth daily.     No current facility-administered medications on file prior to visit.     PAST MEDICAL HISTORY: Past Medical History:  Diagnosis Date  . Abnormal uterine bleeding   . Anemia   . Back pain   . Bacterial vaginosis   . Constipation   . Dysmenorrhea   . Endometriosis   . Fatty liver   . Fibroids   . GERD (gastroesophageal reflux disease)   .  Gonorrhea   . Herpes   . History of chicken pox   . Hypertension   . Joint pain   . Lactose intolerance   . 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 Monilia infection   . Ovarian cyst   . Pneumonia   . Sarcoidosis ([place])   . Trichomonas   . Vaginitis   . Vitamin D deficiency   . Yeast infection     PAST SURGICAL HISTORY: Past Surgical History:  Procedure Laterality Date  . DILATION AND CURETTAGE OF UTERUS    . FOOT SURGERY    . LAPAROSCOPIC OVARIAN CYSTECTOMY    . 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     10 years ago  . WISDOM TOOTH EXTRACTION      SOCIAL HISTORY: Social History  Substance Use Topics  . Smoking status: Never Smoker  . Smokeless tobacco: Never Used  . Alcohol use No    FAMILY HISTORY: Family History  Problem Relation Age of Onset  . Hypertension Mother   . Hyperlipidemia Mother   . Hypertension Father   . Diabetes Father   . Hyperlipidemia Father   . Cancer Father     ROS: Review of Systems  Constitutional: Positive for malaise/fatigue.  HENT:       Hay Fever Mouth Sores  Respiratory: Positive for shortness of breath (on exertion).   Cardiovascular: Negative for chest pain, orthopnea and claudication.  Gastrointestinal: Positive for heartburn.  Musculoskeletal: Negative for myalgias.       Muscle Stiffness  Skin:       Hair or Nail Changes  Psychiatric/Behavioral:       Stress    PHYSICAL EXAM: Blood pressure 130/81, pulse 71, temperature 98.6 F (37 C), temperature source Oral, resp. rate 18, height 5\' 7"  (1.702 m), weight 232 lb (105.2 kg), SpO2 99 %. Body mass index is 36.34 kg/m. Physical Exam  Constitutional: She is oriented to person, place, and time. She appears well-developed and well-nourished.  Cardiovascular: Normal rate.   Pulmonary/Chest: Effort normal.  Musculoskeletal: Normal range of motion.  Neurological: She is oriented to person, place, and time.  Skin: Skin is warm and dry.  Vitals reviewed.   RECENT LABS AND TESTS: BMET      Component Value Date/Time   NA 143 01/13/2014 2119   K 3.3 (L) 01/13/2014 2119   CL 104 01/13/2014 2119   CO2 26 01/13/2014 2119   GLUCOSE 86 01/13/2014 2119   BUN 10 01/13/2014 2119   CREATININE 0.60 01/13/2014 2119   CALCIUM 9.2 01/13/2014 2119   GFRNONAA >90 01/13/2014 2119   GFRAA >90 01/13/2014 2119   No results found for: HGBA1C No results found for: INSULIN CBC    Component Value Date/Time   WBC 4.2 01/13/2014 2119   RBC 4.57 01/13/2014 2119   HGB 12.5 01/13/2014 2119   HCT 37.3 01/13/2014 2119   PLT 174 01/13/2014 2119   MCV 81.6 01/13/2014 2119   MCH 27.4 01/13/2014 2119  MCHC 33.5 01/13/2014 2119   RDW 13.3 01/13/2014 2119   LYMPHSABS 1.3 08/12/2011 1000   MONOABS 0.3 08/12/2011 1000   EOSABS 0.2 08/12/2011 1000   BASOSABS 0.0 08/12/2011 1000   Iron/TIBC/Ferritin/ %Sat No results found for: IRON, TIBC, FERRITIN, IRONPCTSAT Lipid Panel  No results found for: CHOL, TRIG, HDL, CHOLHDL, VLDL, LDLCALC, LDLDIRECT Hepatic Function Panel  No results found for: PROT, ALBUMIN, AST, ALT, ALKPHOS, BILITOT, BILIDIR, IBILI No results found for: TSH  ECG  shows NSR with a rate of 79 BPM INDIRECT CALORIMETER done today shows a VO2 of 160 and a REE of 1112.    ASSESSMENT AND PLAN: Other fatigue - Plan: EKG 12-Lead, Vitamin B12, CBC With Differential, Comprehensive metabolic panel, Folate, Hemoglobin A1c, Insulin, random, T3, T4, free, CANCELED: TSH  Shortness of breath on exertion  Other hyperlipidemia - Plan: CANCELED: Lipid Panel With LDL/HDL Ratio  Vitamin D deficiency - Plan: CANCELED: VITAMIN D 25 Hydroxy (Vit-D Deficiency, Fractures)  Other cirrhosis of liver (HCC)  Depression screening  Class 2 obesity without serious comorbidity with body mass index (BMI) of 36.0 to 36.9 in adult, unspecified obesity type  PLAN:  Fatigue Yvonne Chapman was informed that her fatigue may be related to obesity, depression or many other causes. Labs will be ordered, and in the  meanwhile Yvonne Chapman has agreed to work on diet, exercise and weight loss to help with fatigue. Proper sleep hygiene was discussed including the need for 7-8 hours of quality sleep each night. A sleep study was not ordered based on symptoms and Epworth score.  Dyspnea on exertion Yvonne Chapman's shortness of breath appears to be obesity related and exercise induced. She has agreed to work on weight loss and gradually increase exercise to treat her exercise induced shortness of breath. If Yvonne Chapman follows our instructions and loses weight without improvement of her shortness of breath, we will plan to refer to pulmonology. We will monitor this condition regularly. Yvonne Chapman agrees to this plan.  Hyperlipidemia Yvonne Chapman was informed of the American Heart Association Guidelines emphasizing intensive lifestyle modifications as the first line treatment for hyperlipidemia. We discussed many lifestyle modifications today in depth, and Yvonne Chapman will continue to work on decreasing saturated fats such as fatty red meat, butter and many fried foods. She will also increase vegetables and lean protein in her diet and continue to work on exercise and weight loss efforts. We will re-check labs in 3 months and follow.  Vitamin D Deficiency Yvonne Chapman was informed that low vitamin D levels contributes to fatigue and are associated with obesity, breast, and colon cancer. She agrees to continue to take OTC Vit D @5 ,000 IU every day and we will plan to re-check labs in 3 months and will follow up for routine testing of vitamin D, at least 2-3 times per year. She was informed of the risk of over-replacement of vitamin D and agrees to not increase her dose unless he discusses this with [place] first.  Other Cirrhosis of Liver We will help with healthy weight loss to help decrease Non Alcoholic Fatty Liver Disease and decrease risk of complications. We will check labs and follow.  Depression Screen Yvonne Chapman had a strongly positive depression screening.  Depression is commonly associated with obesity and often results in emotional eating behaviors. We will monitor this closely and work on CBT to help improve the non-hunger eating patterns. Referral to Psychology may be required if no improvement is seen as she continues in our clinic.  Obesity Yvonne Chapman is currently in  the action stage of change and her goal is to continue with weight loss efforts She has agreed to follow the Category 2 plan Yvonne Chapman has been instructed to work up to a goal of 150 minutes of combined cardio and strengthening exercise per week for weight loss and overall health benefits. We discussed the following Behavioral Modification Stratagies today: increasing lean protein intake, increasing vegetables and work on meal planning and easy cooking plans  Yvonne Chapman has agreed to follow up with our clinic in 2 weeks. She was informed of the importance of frequent follow up visits to maximize her success with intensive lifestyle modifications for her multiple health conditions. She was informed we would discuss her lab results at her next visit unless there is a critical issue that needs to be addressed sooner. Yvonne Chapman agreed to keep her next visit at the agreed upon time to discuss these results.  I, Doreene Nest, am acting as scribe for Dennard Nip, MD  I have reviewed the above documentation for accuracy and completeness, and I agree with the above. -Dennard Nip, MD

## 2016-07-28 LAB — COMPREHENSIVE METABOLIC PANEL
ALBUMIN: 3.8 g/dL (ref 3.5–5.5)
ALK PHOS: 159 IU/L — AB (ref 39–117)
ALT: 24 IU/L (ref 0–32)
AST: 45 IU/L — ABNORMAL HIGH (ref 0–40)
Albumin/Globulin Ratio: 1.1 — ABNORMAL LOW (ref 1.2–2.2)
BUN / CREAT RATIO: 15 (ref 9–23)
BUN: 9 mg/dL (ref 6–24)
Bilirubin Total: 0.5 mg/dL (ref 0.0–1.2)
CALCIUM: 8.8 mg/dL (ref 8.7–10.2)
CO2: 25 mmol/L (ref 18–29)
Chloride: 103 mmol/L (ref 96–106)
Creatinine, Ser: 0.62 mg/dL (ref 0.57–1.00)
GFR calc Af Amer: 117 mL/min/{1.73_m2} (ref >59)
GFR, EST NON AFRICAN AMERICAN: 102 mL/min/{1.73_m2} (ref >59)
GLUCOSE: 77 mg/dL (ref 65–99)
Globulin, Total: 3.4 g/dL (ref 1.5–4.5)
Potassium: 3.8 mmol/L (ref 3.5–5.2)
Sodium: 145 mmol/L — ABNORMAL HIGH (ref 134–144)
Total Protein: 7.2 g/dL (ref 6.0–8.5)

## 2016-07-28 LAB — CBC WITH DIFFERENTIAL
BASOS ABS: 0 10*3/uL (ref 0.0–0.2)
Basos: 1 %
EOS (ABSOLUTE): 0.1 10*3/uL (ref 0.0–0.4)
Eos: 3 %
HEMATOCRIT: 38.5 % (ref 34.0–46.6)
HEMOGLOBIN: 12.6 g/dL (ref 11.1–15.9)
IMMATURE GRANULOCYTES: 0 %
Immature Grans (Abs): 0 10*3/uL (ref 0.0–0.1)
Lymphocytes Absolute: 1.6 10*3/uL (ref 0.7–3.1)
Lymphs: 38 %
MCH: 26.6 pg (ref 26.6–33.0)
MCHC: 32.7 g/dL (ref 31.5–35.7)
MCV: 81 fL (ref 79–97)
MONOCYTES: 7 %
Monocytes Absolute: 0.3 10*3/uL (ref 0.1–0.9)
Neutrophils Absolute: 2.1 10*3/uL (ref 1.4–7.0)
Neutrophils: 51 %
RBC: 4.73 x10E6/uL (ref 3.77–5.28)
RDW: 15.3 % (ref 12.3–15.4)
WBC: 4.1 10*3/uL (ref 3.4–10.8)

## 2016-07-28 LAB — HEMOGLOBIN A1C
Est. average glucose Bld gHb Est-mCnc: 94 mg/dL
HEMOGLOBIN A1C: 4.9 % (ref 4.8–5.6)

## 2016-07-28 LAB — T4, FREE: FREE T4: 1.09 ng/dL (ref 0.82–1.77)

## 2016-07-28 LAB — FOLATE: Folate: 14.4 ng/mL (ref >3.0)

## 2016-07-28 LAB — T3: T3, Total: 147 ng/dL (ref 71–180)

## 2016-07-28 LAB — VITAMIN B12: VITAMIN B 12: 599 pg/mL (ref 232–1245)

## 2016-07-28 LAB — INSULIN, RANDOM

## 2016-08-01 ENCOUNTER — Encounter (INDEPENDENT_AMBULATORY_CARE_PROVIDER_SITE_OTHER): Payer: Self-pay | Admitting: Family Medicine

## 2016-08-09 ENCOUNTER — Encounter (INDEPENDENT_AMBULATORY_CARE_PROVIDER_SITE_OTHER): Payer: Self-pay

## 2016-08-09 ENCOUNTER — Ambulatory Visit (INDEPENDENT_AMBULATORY_CARE_PROVIDER_SITE_OTHER): Payer: Federal, State, Local not specified - PPO | Admitting: Family Medicine

## 2016-09-15 DIAGNOSIS — K7460 Unspecified cirrhosis of liver: Secondary | ICD-10-CM | POA: Diagnosis not present

## 2016-09-15 DIAGNOSIS — Z95828 Presence of other vascular implants and grafts: Secondary | ICD-10-CM | POA: Diagnosis not present

## 2016-09-15 DIAGNOSIS — K7469 Other cirrhosis of liver: Secondary | ICD-10-CM | POA: Diagnosis not present

## 2016-09-15 DIAGNOSIS — D869 Sarcoidosis, unspecified: Secondary | ICD-10-CM | POA: Diagnosis not present

## 2016-09-22 DIAGNOSIS — Z1231 Encounter for screening mammogram for malignant neoplasm of breast: Secondary | ICD-10-CM | POA: Diagnosis not present

## 2017-01-30 DIAGNOSIS — R0602 Shortness of breath: Secondary | ICD-10-CM | POA: Diagnosis not present

## 2017-01-30 DIAGNOSIS — R05 Cough: Secondary | ICD-10-CM | POA: Diagnosis not present

## 2017-01-30 DIAGNOSIS — R350 Frequency of micturition: Secondary | ICD-10-CM | POA: Diagnosis not present

## 2017-01-30 DIAGNOSIS — J069 Acute upper respiratory infection, unspecified: Secondary | ICD-10-CM | POA: Diagnosis not present

## 2017-02-06 DIAGNOSIS — Z01419 Encounter for gynecological examination (general) (routine) without abnormal findings: Secondary | ICD-10-CM | POA: Diagnosis not present

## 2017-02-20 DIAGNOSIS — H6121 Impacted cerumen, right ear: Secondary | ICD-10-CM | POA: Diagnosis not present

## 2017-02-20 DIAGNOSIS — J209 Acute bronchitis, unspecified: Secondary | ICD-10-CM | POA: Diagnosis not present

## 2017-02-20 DIAGNOSIS — H66001 Acute suppurative otitis media without spontaneous rupture of ear drum, right ear: Secondary | ICD-10-CM | POA: Diagnosis not present

## 2017-02-21 ENCOUNTER — Other Ambulatory Visit: Payer: Self-pay | Admitting: Family Medicine

## 2017-02-21 ENCOUNTER — Ambulatory Visit
Admission: RE | Admit: 2017-02-21 | Discharge: 2017-02-21 | Disposition: A | Discharge status: Home / Self Care | Payer: Federal, State, Local not specified - PPO | Source: Ambulatory Visit | Attending: Family Medicine | Admitting: Family Medicine

## 2017-02-21 DIAGNOSIS — J40 Bronchitis, not specified as acute or chronic: Secondary | ICD-10-CM

## 2017-02-21 DIAGNOSIS — R05 Cough: Secondary | ICD-10-CM | POA: Diagnosis not present

## 2017-03-26 DIAGNOSIS — K769 Liver disease, unspecified: Secondary | ICD-10-CM | POA: Diagnosis not present

## 2017-03-26 DIAGNOSIS — K7469 Other cirrhosis of liver: Secondary | ICD-10-CM | POA: Diagnosis not present

## 2017-03-26 DIAGNOSIS — K7460 Unspecified cirrhosis of liver: Secondary | ICD-10-CM | POA: Diagnosis not present

## 2017-03-26 DIAGNOSIS — Z95828 Presence of other vascular implants and grafts: Secondary | ICD-10-CM | POA: Diagnosis not present

## 2017-03-26 DIAGNOSIS — E669 Obesity, unspecified: Secondary | ICD-10-CM | POA: Diagnosis not present

## 2017-03-26 DIAGNOSIS — I728 Aneurysm of other specified arteries: Secondary | ICD-10-CM | POA: Diagnosis not present

## 2017-03-26 DIAGNOSIS — R161 Splenomegaly, not elsewhere classified: Secondary | ICD-10-CM | POA: Diagnosis not present

## 2017-05-29 DIAGNOSIS — T148XXA Other injury of unspecified body region, initial encounter: Secondary | ICD-10-CM | POA: Diagnosis not present

## 2017-07-10 DIAGNOSIS — Z23 Encounter for immunization: Secondary | ICD-10-CM | POA: Diagnosis not present

## 2017-07-10 DIAGNOSIS — E7800 Pure hypercholesterolemia, unspecified: Secondary | ICD-10-CM | POA: Diagnosis not present

## 2017-07-10 DIAGNOSIS — Z0000 Encounter for general adult medical examination without abnormal findings: Secondary | ICD-10-CM | POA: Diagnosis not present

## 2017-07-17 ENCOUNTER — Ambulatory Visit
Admission: RE | Admit: 2017-07-17 | Discharge: 2017-07-17 | Disposition: A | Discharge status: Home / Self Care | Payer: Federal, State, Local not specified - PPO | Source: Ambulatory Visit | Attending: Family Medicine | Admitting: Family Medicine

## 2017-07-17 ENCOUNTER — Other Ambulatory Visit: Payer: Self-pay | Admitting: Family Medicine

## 2017-07-17 DIAGNOSIS — J209 Acute bronchitis, unspecified: Secondary | ICD-10-CM

## 2017-07-17 DIAGNOSIS — R05 Cough: Secondary | ICD-10-CM | POA: Diagnosis not present

## 2017-08-05 DIAGNOSIS — M79602 Pain in left arm: Secondary | ICD-10-CM | POA: Diagnosis not present

## 2017-08-05 DIAGNOSIS — M25512 Pain in left shoulder: Secondary | ICD-10-CM | POA: Diagnosis not present

## 2017-08-09 DIAGNOSIS — M79622 Pain in left upper arm: Secondary | ICD-10-CM | POA: Diagnosis not present

## 2017-08-22 DIAGNOSIS — E875 Hyperkalemia: Secondary | ICD-10-CM | POA: Diagnosis not present

## 2017-08-22 DIAGNOSIS — D72819 Decreased white blood cell count, unspecified: Secondary | ICD-10-CM | POA: Diagnosis not present

## 2017-09-25 DIAGNOSIS — M25512 Pain in left shoulder: Secondary | ICD-10-CM | POA: Diagnosis not present

## 2017-09-25 DIAGNOSIS — M7552 Bursitis of left shoulder: Secondary | ICD-10-CM | POA: Diagnosis not present

## 2017-09-25 DIAGNOSIS — Z1231 Encounter for screening mammogram for malignant neoplasm of breast: Secondary | ICD-10-CM | POA: Diagnosis not present

## 2017-10-03 DIAGNOSIS — D869 Sarcoidosis, unspecified: Secondary | ICD-10-CM | POA: Diagnosis not present

## 2017-10-03 DIAGNOSIS — K7469 Other cirrhosis of liver: Secondary | ICD-10-CM | POA: Diagnosis not present

## 2017-10-03 DIAGNOSIS — Z95828 Presence of other vascular implants and grafts: Secondary | ICD-10-CM | POA: Diagnosis not present

## 2017-10-03 DIAGNOSIS — E876 Hypokalemia: Secondary | ICD-10-CM | POA: Diagnosis not present

## 2017-10-05 DIAGNOSIS — J209 Acute bronchitis, unspecified: Secondary | ICD-10-CM | POA: Diagnosis not present

## 2017-10-10 DIAGNOSIS — M7552 Bursitis of left shoulder: Secondary | ICD-10-CM | POA: Diagnosis not present

## 2017-10-10 DIAGNOSIS — M25512 Pain in left shoulder: Secondary | ICD-10-CM | POA: Diagnosis not present

## 2017-12-20 DIAGNOSIS — S29012A Strain of muscle and tendon of back wall of thorax, initial encounter: Secondary | ICD-10-CM | POA: Diagnosis not present

## 2018-02-11 DIAGNOSIS — Z6837 Body mass index (BMI) 37.0-37.9, adult: Secondary | ICD-10-CM | POA: Diagnosis not present

## 2018-02-11 DIAGNOSIS — K739 Chronic hepatitis, unspecified: Secondary | ICD-10-CM | POA: Diagnosis not present

## 2018-02-11 DIAGNOSIS — Z01419 Encounter for gynecological examination (general) (routine) without abnormal findings: Secondary | ICD-10-CM | POA: Diagnosis not present

## 2018-02-28 DIAGNOSIS — B372 Candidiasis of skin and nail: Secondary | ICD-10-CM | POA: Diagnosis not present

## 2018-04-17 DIAGNOSIS — Z95828 Presence of other vascular implants and grafts: Secondary | ICD-10-CM | POA: Diagnosis not present

## 2018-04-17 DIAGNOSIS — Z713 Dietary counseling and surveillance: Secondary | ICD-10-CM | POA: Diagnosis not present

## 2018-04-17 DIAGNOSIS — Z6834 Body mass index (BMI) 34.0-34.9, adult: Secondary | ICD-10-CM | POA: Diagnosis not present

## 2018-04-17 DIAGNOSIS — E669 Obesity, unspecified: Secondary | ICD-10-CM | POA: Diagnosis not present

## 2018-04-17 DIAGNOSIS — K7469 Other cirrhosis of liver: Secondary | ICD-10-CM | POA: Diagnosis not present

## 2018-04-17 DIAGNOSIS — D869 Sarcoidosis, unspecified: Secondary | ICD-10-CM | POA: Diagnosis not present

## 2018-06-14 DIAGNOSIS — I728 Aneurysm of other specified arteries: Secondary | ICD-10-CM | POA: Diagnosis not present

## 2018-06-14 DIAGNOSIS — K766 Portal hypertension: Secondary | ICD-10-CM | POA: Diagnosis not present

## 2018-06-14 DIAGNOSIS — Z95828 Presence of other vascular implants and grafts: Secondary | ICD-10-CM | POA: Diagnosis not present

## 2018-07-15 DIAGNOSIS — E7800 Pure hypercholesterolemia, unspecified: Secondary | ICD-10-CM | POA: Diagnosis not present

## 2018-07-15 DIAGNOSIS — E559 Vitamin D deficiency, unspecified: Secondary | ICD-10-CM | POA: Diagnosis not present

## 2018-07-15 DIAGNOSIS — Z0000 Encounter for general adult medical examination without abnormal findings: Secondary | ICD-10-CM | POA: Diagnosis not present

## 2018-08-24 ENCOUNTER — Other Ambulatory Visit: Payer: Self-pay

## 2018-08-24 ENCOUNTER — Emergency Department (HOSPITAL_BASED_OUTPATIENT_CLINIC_OR_DEPARTMENT_OTHER)
Admission: EM | Admit: 2018-08-24 | Discharge: 2018-08-24 | Disposition: A | Discharge status: Home / Self Care | Payer: Federal, State, Local not specified - PPO | Attending: Emergency Medicine | Admitting: Emergency Medicine

## 2018-08-24 ENCOUNTER — Encounter (HOSPITAL_BASED_OUTPATIENT_CLINIC_OR_DEPARTMENT_OTHER): Payer: Self-pay | Admitting: Emergency Medicine

## 2018-08-24 ENCOUNTER — Emergency Department (HOSPITAL_BASED_OUTPATIENT_CLINIC_OR_DEPARTMENT_OTHER): Payer: Federal, State, Local not specified - PPO

## 2018-08-24 DIAGNOSIS — Z79899 Other long term (current) drug therapy: Secondary | ICD-10-CM | POA: Insufficient documentation

## 2018-08-24 DIAGNOSIS — N132 Hydronephrosis with renal and ureteral calculous obstruction: Secondary | ICD-10-CM | POA: Diagnosis not present

## 2018-08-24 DIAGNOSIS — N201 Calculus of ureter: Secondary | ICD-10-CM

## 2018-08-24 DIAGNOSIS — I728 Aneurysm of other specified arteries: Secondary | ICD-10-CM

## 2018-08-24 DIAGNOSIS — I10 Essential (primary) hypertension: Secondary | ICD-10-CM | POA: Insufficient documentation

## 2018-08-24 DIAGNOSIS — R1031 Right lower quadrant pain: Secondary | ICD-10-CM | POA: Diagnosis not present

## 2018-08-24 LAB — URINALYSIS, ROUTINE W REFLEX MICROSCOPIC
Bilirubin Urine: NEGATIVE
Glucose, UA: NEGATIVE mg/dL
Ketones, ur: NEGATIVE mg/dL
Nitrite: NEGATIVE
Protein, ur: NEGATIVE mg/dL
Specific Gravity, Urine: 1.02 (ref 1.005–1.030)
pH: 7 (ref 5.0–8.0)

## 2018-08-24 LAB — URINALYSIS, MICROSCOPIC (REFLEX)

## 2018-08-24 LAB — COMPREHENSIVE METABOLIC PANEL
ALT: 21 U/L (ref 0–44)
AST: 35 U/L (ref 15–41)
Albumin: 3.2 g/dL — ABNORMAL LOW (ref 3.5–5.0)
Alkaline Phosphatase: 141 U/L — ABNORMAL HIGH (ref 38–126)
Anion gap: 6 (ref 5–15)
BUN: 13 mg/dL (ref 6–20)
CO2: 25 mmol/L (ref 22–32)
Calcium: 8.9 mg/dL (ref 8.9–10.3)
Chloride: 109 mmol/L (ref 98–111)
Creatinine, Ser: 0.64 mg/dL (ref 0.44–1.00)
GFR calc Af Amer: >60 mL/min (ref >60)
GFR calc non Af Amer: >60 mL/min (ref >60)
Glucose, Bld: 113 mg/dL — ABNORMAL HIGH (ref 70–99)
Potassium: 3.4 mmol/L — ABNORMAL LOW (ref 3.5–5.1)
Sodium: 140 mmol/L (ref 135–145)
Total Bilirubin: 0.7 mg/dL (ref 0.3–1.2)
Total Protein: 6.5 g/dL (ref 6.5–8.1)

## 2018-08-24 LAB — CBC WITH DIFFERENTIAL/PLATELET
Abs Immature Granulocytes: 0.01 10*3/uL (ref 0.00–0.07)
Basophils Absolute: 0 10*3/uL (ref 0.0–0.1)
Basophils Relative: 1 %
Eosinophils Absolute: 0.1 10*3/uL (ref 0.0–0.5)
Eosinophils Relative: 4 %
HCT: 37.7 % (ref 36.0–46.0)
Hemoglobin: 11.9 g/dL — ABNORMAL LOW (ref 12.0–15.0)
Immature Granulocytes: 0 %
Lymphocytes Relative: 31 %
Lymphs Abs: 1 10*3/uL (ref 0.7–4.0)
MCH: 27 pg (ref 26.0–34.0)
MCHC: 31.6 g/dL (ref 30.0–36.0)
MCV: 85.5 fL (ref 80.0–100.0)
Monocytes Absolute: 0.4 10*3/uL (ref 0.1–1.0)
Monocytes Relative: 12 %
Neutro Abs: 1.7 10*3/uL (ref 1.7–7.7)
Neutrophils Relative %: 52 %
Platelets: 167 10*3/uL (ref 150–400)
RBC: 4.41 MIL/uL (ref 3.87–5.11)
RDW: 13.5 % (ref 11.5–15.5)
WBC: 3.2 10*3/uL — ABNORMAL LOW (ref 4.0–10.5)
nRBC: 0 % (ref 0.0–0.2)

## 2018-08-24 MED ORDER — KETOROLAC TROMETHAMINE 15 MG/ML IJ SOLN
15.0000 mg | Freq: Once | INTRAMUSCULAR | Status: AC
  Administered 2018-08-24: 02:00:00 15 mg via INTRAVENOUS
  Filled 2018-08-24: qty 1

## 2018-08-24 MED ORDER — FENTANYL CITRATE (PF) 100 MCG/2ML IJ SOLN: 50.0000 ug | Freq: Once | INTRAMUSCULAR | Status: DC

## 2018-08-24 MED ORDER — ONDANSETRON HCL 4 MG/2ML IJ SOLN
4.0000 mg | Freq: Once | INTRAMUSCULAR | Status: AC
  Administered 2018-08-24: 4 mg via INTRAVENOUS
  Filled 2018-08-24: qty 2

## 2018-08-24 MED ORDER — ONDANSETRON 8 MG PO TBDP: 8.0000 mg | ORAL_TABLET | Freq: Three times a day (TID) | ORAL | 0 refills | Status: DC | PRN

## 2018-08-24 MED ORDER — OXYCODONE-ACETAMINOPHEN 10-325 MG PO TABS: 1.0000 | ORAL_TABLET | ORAL | 0 refills | Status: DC | PRN

## 2018-08-24 NOTE — ED Notes (Signed)
Radiology unable to get CT to burn onto CD. Radiology will call pt to come pick up CD at a later date when machine is working.

## 2018-08-24 NOTE — ED Triage Notes (Signed)
Pt reports right lower abd pain and right sided lower back pain with sudden onset. Pt reports urine dark in color.

## 2018-08-24 NOTE — ED Provider Notes (Signed)
[facility] Provider Note: Georgena Spurling, MD, FACEP  CSN: [id number] MRN: [id number] ARRIVAL: 08/24/18 at [place]: [facility]   HISTORY OF PRESENT ILLNESS  08/24/18 12:45 AM Yvonne Chapman is a 58 y.o. female here with the sudden onset of lower back pain, right greater than left, radiating to her right lower quadrant.  This began about 8 PM yesterday evening.  She rates her pain as an 8 out of 10.  It is somewhat worse with movement or palpation.  She noticed dark urine earlier although it is not dark at the present time.  She describes the pain is sharp.  She also had a sharp, transient, needlelike pain with urination.  She was noted to have a low-grade fever of 99.6 on arrival.  She has had chills but she denies nausea, vomiting or diarrhea   Past Medical History:  Diagnosis Date  . Abnormal uterine bleeding   . Anemia   . Back pain   . Bacterial vaginosis   . Constipation   . Dysmenorrhea   . Endometriosis   . Fatty liver   . Fibroids   . GERD (gastroesophageal reflux disease)   . Gonorrhea   . Herpes   . History of chicken pox   . Hypertension   . Joint pain   . Lactose intolerance   . 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 Monilia infection   . Ovarian cyst   . Pneumonia   . Sarcoidosis   . Trichomonas   . Vaginitis   . Vitamin D deficiency   . Yeast infection     Past Surgical History:  Procedure Laterality Date  . ABDOMINAL HYSTERECTOMY    . DILATION AND CURETTAGE OF UTERUS    . FOOT SURGERY    . LAPAROSCOPIC OVARIAN CYSTECTOMY    . 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     10 years ago  . WISDOM TOOTH EXTRACTION      Family History  Problem Relation Age of Onset  . Hypertension Mother   . Hyperlipidemia Mother   . Hypertension Father   . Diabetes Father   . Hyperlipidemia Father   . Cancer Father     Social History   Tobacco Use  . Smoking status: Never Smoker  . Smokeless tobacco: Never Used  Substance Use Topics  .  Alcohol use: No  . Drug use: No    Prior to Admission medications   Medication Sig Start Date End Date Taking? Authorizing Provider  Cholecalciferol (VITAMIN D PO) Take by mouth.    [provider]  Fexofenadine HCl (ALLEGRA PO) Take by mouth.    [provider]  Multiple Vitamin (MULITIVITAMIN WITH MINERALS) TABS Take 1 tablet by mouth daily.    [provider]  Turmeric 500 MG CAPS Take 1 tablet by mouth every morning.    [provider]    Allergies Floxin [ofloxacin]; Metronidazole; and Sulfur   REVIEW OF SYSTEMS  Negative except as noted here or in the History of Present Illness.   PHYSICAL EXAMINATION  Initial Vital Signs Blood pressure 140/72, pulse 90, temperature 99.6 F (37.6 C), temperature source Oral, resp. rate 16, height 5\' 7"  (1.702 m), weight 99.8 kg, SpO2 100 %.  Examination General: Well-developed, well-nourished female in no acute distress; appearance consistent with age of record HENT: normocephalic; atraumatic Eyes: pupils equal, round and reactive to light; extraocular muscles intact Neck: supple Heart: regular rate and rhythm  Lungs: clear to auscultation bilaterally Abdomen: soft; nondistended; mild right lower quadrant tenderness; bowel sounds present GU: No CVA tenderness; urine clear and yellow Extremities: No deformity; full range of motion; pulses normal Neurologic: Awake, alert and oriented; motor function intact in all extremities and symmetric; no facial droop Skin: Warm and dry Psychiatric: Normal mood and affect   RESULTS  Summary of this visit's results, reviewed by myself:   EKG Interpretation  Date/Time:    Ventricular Rate:    PR Interval:    QRS Duration:   QT Interval:    QTC Calculation:   R Axis:     Text Interpretation:        Laboratory Studies: Results for orders placed or performed during the hospital encounter of 08/24/18 (from the past 24 hour(s))  Urinalysis, Routine w reflex  microscopic     Status: Abnormal   Collection Time: 08/24/18 12:42 AM  Result Value Ref Range   Color, Urine YELLOW YELLOW   APPearance CLEAR CLEAR   Specific Gravity, Urine 1.020 1.005 - 1.030   pH 7.0 5.0 - 8.0   Glucose, UA NEGATIVE NEGATIVE mg/dL   Hgb urine dipstick MODERATE (A) NEGATIVE   Bilirubin Urine NEGATIVE NEGATIVE   Ketones, ur NEGATIVE NEGATIVE mg/dL   Protein, ur NEGATIVE NEGATIVE mg/dL   Nitrite NEGATIVE NEGATIVE   Leukocytes,Ua TRACE (A) NEGATIVE  Urinalysis, Microscopic (reflex)     Status: Abnormal   Collection Time: 08/24/18 12:42 AM  Result Value Ref Range   RBC / HPF 11-20 0 - 5 RBC/hpf   WBC, UA 0-5 0 - 5 WBC/hpf   Bacteria, UA FEW (A) NONE SEEN   Squamous Epithelial / LPF 0-5 0 - 5  CBC with Differential/Platelet     Status: Abnormal   Collection Time: 08/24/18  1:29 AM  Result Value Ref Range   WBC 3.2 (L) 4.0 - 10.5 K/uL   RBC 4.41 3.87 - 5.11 MIL/uL   Hemoglobin 11.9 (L) 12.0 - 15.0 g/dL   HCT 37.7 36.0 - 46.0 %   MCV 85.5 80.0 - 100.0 fL   MCH 27.0 26.0 - 34.0 pg   MCHC 31.6 30.0 - 36.0 g/dL   RDW 13.5 11.5 - 15.5 %   Platelets 167 150 - 400 K/uL   nRBC 0.0 0.0 - 0.2 %   Neutrophils Relative % 52 %   Neutro Abs 1.7 1.7 - 7.7 K/uL   Lymphocytes Relative 31 %   Lymphs Abs 1.0 0.7 - 4.0 K/uL   Monocytes Relative 12 %   Monocytes Absolute 0.4 0.1 - 1.0 K/uL   Eosinophils Relative 4 %   Eosinophils Absolute 0.1 0.0 - 0.5 K/uL   Basophils Relative 1 %   Basophils Absolute 0.0 0.0 - 0.1 K/uL   Immature Granulocytes 0 %   Abs Immature Granulocytes 0.01 0.00 - 0.07 K/uL  Comprehensive metabolic panel     Status: Abnormal   Collection Time: 08/24/18  1:29 AM  Result Value Ref Range   Sodium 140 135 - 145 mmol/L   Potassium 3.4 (L) 3.5 - 5.1 mmol/L   Chloride 109 98 - 111 mmol/L   CO2 25 22 - 32 mmol/L   Glucose, Bld 113 (H) 70 - 99 mg/dL   BUN 13 6 - 20 mg/dL   Creatinine, Ser 0.64 0.44 - 1.00 mg/dL   Calcium 8.9 8.9 - 10.3 mg/dL   Total  Protein 6.5 6.5 - 8.1 g/dL   Albumin 3.2 (L) 3.5 - 5.0 g/dL  AST 35 15 - 41 U/L   ALT 21 0 - 44 U/L   Alkaline Phosphatase 141 (H) 38 - 126 U/L   Total Bilirubin 0.7 0.3 - 1.2 mg/dL   GFR calc non Af Amer >60 >60 mL/min   GFR calc Af Amer >60 >60 mL/min   Anion gap 6 5 - 15   Imaging Studies: Ct Renal Stone Study  Result Date: 08/24/2018 CLINICAL DATA:  Right lower abdominal pain, back pain EXAM: CT ABDOMEN AND PELVIS WITHOUT CONTRAST TECHNIQUE: Multidetector CT imaging of the abdomen and pelvis was performed following the standard protocol without IV contrast. COMPARISON:  CT 08/12/2008 FINDINGS: Lower chest: Lung bases are clear. No effusions. Heart is normal size. Hepatobiliary: Tips stent is noted in the liver. No focal hepatic abnormality. Gallbladder unremarkable. Pancreas: No focal abnormality or ductal dilatation. Spleen: No focal abnormality.  Normal size. Adrenals/Urinary Tract: Mild right hydronephrosis due to punctate 1 mm right UVJ stone. No stones or hydronephrosis on the left. Adrenal glands are unremarkable. Urinary bladder decompressed. Stomach/Bowel: Stomach, large and small bowel grossly unremarkable. Normal appendix. Vascular/Lymphatic: No evidence of aortic aneurysm or adenopathy. 3.2 cm partially calcified splenic artery aneurysm noted, which measure 2.7 cm in 2010. Reproductive: Prior hysterectomy.  No adnexal masses. Other: No free fluid or free air. Musculoskeletal: No acute bony abnormality. IMPRESSION: Punctate 1 mm right UVJ stone with mild right hydronephrosis. 3.2 cm splenic artery aneurysm with partial rim calcifications. This has enlarged from 2.7 cm in 2010. Tips stent noted within the liver. Electronically Signed   By: Rolm Baptise M.D.   On: 08/24/2018 01:20    ED COURSE and MDM  Nursing notes and initial vitals signs, including pulse oximetry, reviewed.  Vitals:   08/24/18 0043 08/24/18 0045  BP:  140/72  Pulse:  90  Resp:  16  Temp:  99.6 F (37.6 C)   TempSrc:  Oral  SpO2:  100%  Weight: 99.8 kg   Height: 5\' 7"  (1.702 m)    2:08 AM Patient feeling better after IV medications.  Patient advised of CT findings showing small UVJ stone as well as splenic artery aneurysm.  She will follow-up with her primary care physician regarding surveillance of this aneurysm.  PROCEDURES    ED DIAGNOSES     ICD-10-CM   1. Ureterolithiasis N20.1   2. Splenic artery aneurysm ([place]) I72.8        Angeldejesus Callaham, Jenny Reichmann, MD 08/24/18 [phone]

## 2018-08-26 DIAGNOSIS — N200 Calculus of kidney: Secondary | ICD-10-CM | POA: Diagnosis not present

## 2018-08-26 DIAGNOSIS — I728 Aneurysm of other specified arteries: Secondary | ICD-10-CM | POA: Diagnosis not present

## 2018-08-28 DIAGNOSIS — M545 Low back pain: Secondary | ICD-10-CM | POA: Diagnosis not present

## 2018-08-28 DIAGNOSIS — N201 Calculus of ureter: Secondary | ICD-10-CM | POA: Diagnosis not present

## 2018-09-12 DIAGNOSIS — N201 Calculus of ureter: Secondary | ICD-10-CM | POA: Diagnosis not present

## 2018-10-09 ENCOUNTER — Telehealth (HOSPITAL_COMMUNITY): Payer: Self-pay | Admitting: Rehabilitation

## 2018-10-09 NOTE — Telephone Encounter (Signed)

## 2018-10-10 ENCOUNTER — Ambulatory Visit: Payer: Federal, State, Local not specified - PPO | Admitting: Vascular Surgery

## 2018-10-10 ENCOUNTER — Other Ambulatory Visit: Payer: Self-pay

## 2018-10-10 ENCOUNTER — Encounter: Payer: Self-pay | Admitting: Vascular Surgery

## 2018-10-10 ENCOUNTER — Encounter: Payer: Self-pay | Admitting: *Deleted

## 2018-10-10 VITALS — BP 147/78 | HR 63 | Temp 97.9°F | Ht 67.0 in | Wt 222.8 lb

## 2018-10-10 DIAGNOSIS — I728 Aneurysm of other specified arteries: Secondary | ICD-10-CM | POA: Diagnosis not present

## 2018-10-11 ENCOUNTER — Other Ambulatory Visit: Payer: Self-pay | Admitting: *Deleted

## 2018-10-11 NOTE — Progress Notes (Signed)
Referring Physician: Dr Azalia Bilis  Patient name: Yvonne Chapman MRN: [id number] DOB: 1960-06-18 Sex: female  REASON FOR CONSULT: Splenic artery aneurysm  HPI: Yvonne Chapman is a 58 y.o. 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  She recently had a CT scan for evaluation of a renal stone and was noted that the splenic artery aneurysm had dilated to 3.2 cm diameter.  She has no significant abdominal or left upper quadrant abdominal pain.  She has no family history of aneurysms.  Of note she did have a tips procedure about 9 years ago after esophageal varices bleeding.  Last revision of this was 3 to 4 years ago.  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  She has had no prior abdominal operations.  Other medical problems include reflux, hypertension, all of which are currently stable.  Past Medical History:  Diagnosis Date  . Abnormal uterine bleeding   . Anemia   . Back pain   . Bacterial vaginosis   . Constipation   . Dysmenorrhea   . Endometriosis   . Fatty liver   . Fibroids   . GERD (gastroesophageal reflux disease)   . Gonorrhea   . Herpes   . History of chicken pox   . Hypertension   . Joint pain   . Lactose intolerance   . 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 Monilia infection   . Ovarian cyst   . Pneumonia   . Sarcoidosis   . Trichomonas   . Vaginitis   . Vitamin D deficiency   . Yeast infection    Past Surgical History:  Procedure Laterality Date  . ABDOMINAL HYSTERECTOMY    . DILATION AND CURETTAGE OF UTERUS    . FOOT SURGERY    . LAPAROSCOPIC OVARIAN CYSTECTOMY    . 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     10 years ago  . WISDOM TOOTH EXTRACTION      Family History  Problem Relation Age of Onset  . Hypertension Mother   . Hyperlipidemia Mother   . Hypertension Father   . Diabetes Father   . Hyperlipidemia Father   . Cancer Father     SOCIAL HISTORY: Social History   Socioeconomic History  . Marital  status: Single    Spouse name: Not on file  . Number of children: Not on file  . Years of education: Not on file  . Highest education level: Not on file  Occupational History  . Occupation: Therapist, occupational    Comment: [employer]  Social Needs  . Financial resource strain: Not on file  . Food insecurity    Worry: Not on file    Inability: Not on file  . Transportation needs    Medical: Not on file    Non-medical: Not on file  Tobacco Use  . Smoking status: Never Smoker  . Smokeless tobacco: Never Used  Substance and Sexual Activity  . Alcohol use: No  . Drug use: No  . Sexual activity: Not Currently    Partners: Male  Lifestyle  . Physical activity    Days per week: Not on file    Minutes per session: Not on file  . Stress: Not on file  Relationships  . Social Herbalist on phone: Not on file    Gets together: Not on file    Attends religious service: Not on file  Active member of club or organization: Not on file    Attends meetings of clubs or organizations: Not on file    Relationship status: Not on file  . Intimate partner violence    Fear of current or ex partner: Not on file    Emotionally abused: Not on file    Physically abused: Not on file    Forced sexual activity: Not on file  Other Topics Concern  . Not on file  Social History Narrative  . Not on file    Allergies  Allergen Reactions  . Floxin [Ofloxacin]   . Metronidazole Nausea Only  . Sulfur Nausea Only    Current Outpatient Medications  Medication Sig Dispense Refill  . Cholecalciferol (VITAMIN D PO) Take by mouth.    . Multiple Vitamin (MULITIVITAMIN WITH MINERALS) TABS Take 1 tablet by mouth daily.    Marland Kitchen Fexofenadine HCl (ALLEGRA PO) Take by mouth.    . ondansetron (ZOFRAN ODT) 8 MG disintegrating tablet Take 1 tablet (8 mg total) by mouth every 8 (eight) hours as needed for nausea or vomiting. (Patient not taking: Reported on 10/10/2018) 10 tablet 0  .  oxyCODONE-acetaminophen (PERCOCET) 10-325 MG tablet Take 1 tablet by mouth every 4 (four) hours as needed (for severe pain). (Patient not taking: Reported on 10/10/2018) 30 tablet 0  . Turmeric 500 MG CAPS Take 1 tablet by mouth every morning.     No current facility-administered medications for this visit.     ROS:   General:  No weight loss, Fever, chills  HEENT: No recent headaches, no nasal bleeding, no visual changes, no sore throat  Neurologic: No dizziness, blackouts, seizures. No recent symptoms of stroke or mini- stroke. No recent episodes of slurred speech, or temporary blindness.  Cardiac: No recent episodes of chest pain/pressure, no shortness of breath at rest.  No shortness of breath with exertion.  Denies history of atrial fibrillation or irregular heartbeat  Vascular: No history of rest pain in feet.  No history of claudication.  No history of non-healing ulcer, No history of DVT   Pulmonary: No home oxygen, no productive cough, no hemoptysis,  No asthma or wheezing  Musculoskeletal:  [ ]  Arthritis, [ ]  Low back pain,  [ ]  Joint pain  Hematologic:No history of hypercoagulable state.  No history of easy bleeding.  No history of anemia  Gastrointestinal: No hematochezia or melena,  No gastroesophageal reflux, no trouble swallowing  Urinary: [ ]  chronic Kidney disease, [ ]  on HD - [ ]  MWF or [ ]  TTHS, [ ]  Burning with urination, [ ]  Frequent urination, [ ]  Difficulty urinating;   Skin: No rashes  Psychological: No history of anxiety,  No history of depression   Physical Examination  Vitals:   10/10/18 1418  BP: (!) 147/78  Pulse: 63  Temp: 97.9 F (36.6 C)  TempSrc: Oral  SpO2: 100%  Weight: 222 lb 12.8 oz (101.1 kg)  Height: 5\' 7"  (1.702 m)    Body mass index is 34.9 kg/m.  General:  Alert and oriented, no acute distress HEENT: Normal Neck: NoJVD Pulmonary: Clear to auscultation bilaterally Cardiac: Regular Rate and Rhythm  Abdomen: Soft,  non-tender, non-distended, no mass Skin: No rash Extremity Pulses:  2+ radial, brachial, femoral, dorsalis pedis, posterior tibial pulses bilaterally Musculoskeletal: No deformity or edema  Neurologic: Upper and lower extremity motor 5/5 and symmetric  DATA:  Reviewed a noncontrast CT scan that was done recently for renal stone.  These images show a  3.2 cm splenic artery aneurysm.  The artery is fairly tortuous but is difficult to tell whether or not this would be for successful with a covered stent versus coil embolization based on the CT images.  ASSESSMENT: 3.2 cm splenic artery aneurysm nonruptured which has grown from 2.7 cm in diameter over the last few years.  Patient is at risk for rupture.   PLAN: Abdominal aortogram with mesenteric angiogram possible coil embolization or stenting of her splenic artery aneurysm scheduled for November 09, 2018.  Risk benefits possible complications of procedure details were discussed with patient today including not limited to bleeding infection splenic infarction vessel injury.  I also discussed with her the possibility of open repaired she is opted for percutaneous option.  She has follow-up scheduled with her liver physician in [place] in the next few weeks and will also discuss our findings with that doctor.   Ruta Hinds, MD [facility] Office: [phone] Pager: [phone]

## 2018-10-17 DIAGNOSIS — Z1231 Encounter for screening mammogram for malignant neoplasm of breast: Secondary | ICD-10-CM | POA: Diagnosis not present

## 2018-10-21 DIAGNOSIS — D869 Sarcoidosis, unspecified: Secondary | ICD-10-CM | POA: Diagnosis not present

## 2018-10-21 DIAGNOSIS — K7469 Other cirrhosis of liver: Secondary | ICD-10-CM | POA: Diagnosis not present

## 2018-10-21 DIAGNOSIS — E876 Hypokalemia: Secondary | ICD-10-CM | POA: Diagnosis not present

## 2018-10-21 DIAGNOSIS — I728 Aneurysm of other specified arteries: Secondary | ICD-10-CM | POA: Diagnosis not present

## 2018-10-21 DIAGNOSIS — R161 Splenomegaly, not elsewhere classified: Secondary | ICD-10-CM | POA: Diagnosis not present

## 2018-10-21 DIAGNOSIS — Z95828 Presence of other vascular implants and grafts: Secondary | ICD-10-CM | POA: Diagnosis not present

## 2018-10-21 DIAGNOSIS — K769 Liver disease, unspecified: Secondary | ICD-10-CM | POA: Diagnosis not present

## 2018-10-30 ENCOUNTER — Telehealth: Payer: Self-pay | Admitting: *Deleted

## 2018-10-30 NOTE — Telephone Encounter (Signed)
Patient called and left message she is cancelling 11/08/2018 procedure. Unable to reach patient on phone when called back. Jetta notified in [place] lab.

## 2018-11-05 ENCOUNTER — Inpatient Hospital Stay (HOSPITAL_COMMUNITY): Admission: RE | Admit: 2018-11-05 | Payer: Federal, State, Local not specified - PPO | Source: Ambulatory Visit

## 2018-11-08 ENCOUNTER — Encounter (HOSPITAL_COMMUNITY): Payer: Self-pay

## 2018-11-08 ENCOUNTER — Ambulatory Visit (HOSPITAL_COMMUNITY): Admit: 2018-11-08 | Payer: Federal, State, Local not specified - PPO | Admitting: Vascular Surgery

## 2018-11-08 SURGERY — VISCERAL ANGIOGRAPHY
Anesthesia: LOCAL

## 2019-01-22 DIAGNOSIS — E7800 Pure hypercholesterolemia, unspecified: Secondary | ICD-10-CM | POA: Diagnosis not present

## 2019-03-05 DIAGNOSIS — Z1239 Encounter for other screening for malignant neoplasm of breast: Secondary | ICD-10-CM | POA: Diagnosis not present

## 2019-03-05 DIAGNOSIS — Z1211 Encounter for screening for malignant neoplasm of colon: Secondary | ICD-10-CM | POA: Diagnosis not present

## 2019-03-05 DIAGNOSIS — Z01419 Encounter for gynecological examination (general) (routine) without abnormal findings: Secondary | ICD-10-CM | POA: Diagnosis not present

## 2019-03-05 DIAGNOSIS — Z6837 Body mass index (BMI) 37.0-37.9, adult: Secondary | ICD-10-CM | POA: Diagnosis not present

## 2019-03-05 DIAGNOSIS — B372 Candidiasis of skin and nail: Secondary | ICD-10-CM | POA: Diagnosis not present

## 2019-03-05 DIAGNOSIS — R011 Cardiac murmur, unspecified: Secondary | ICD-10-CM | POA: Diagnosis not present

## 2019-03-20 NOTE — Progress Notes (Signed)
Cardiology Office Note   Date:  03/21/2019   ID:  Yvonne Chapman, DOB May 07, 1960, MRN [id number]  PCP:  Yvonne Frees, MD  Cardiologist:   No primary care provider on file. Referring:  Yvonne Bern, MD  Chief Complaint  Patient presents with  . Heart Murmur      History of Present Illness: Yvonne Chapman is a 58 y.o. female who is referred by .repro for evaluation of murmur.  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 She did have an echo in 2014 which was unremarkable.  She recently was found to have a systolic heart murmur.  She was referred for evaluation of this.  Physical she has this otherwise.  She was having some problems with her blood pressure.  She denies any cardiovascular symptoms.  She has no palpitations, presyncope or syncope.  Has had no weight gain or edema.  She does not exercise routinely.  She is under stress taking care of her 16 year old mom.  He had just a quick question that they gave you the name Yvonne Chapman  Past Medical History:  Diagnosis Date  . Abnormal uterine bleeding   . Anemia   . Back pain   . Bacterial vaginosis   . Dysmenorrhea   . Endometriosis   . Fatty liver   . Fibroids   . GERD (gastroesophageal reflux disease)   . Gonorrhea   . Herpes   . History of chicken pox   . Hypertension   . Lactose intolerance   . 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 Monilia infection   . Ovarian cyst   . Sarcoidosis     Past Surgical History:  Procedure Laterality Date  . ABDOMINAL HYSTERECTOMY    . DILATION AND CURETTAGE OF UTERUS    . FOOT SURGERY    . LAPAROSCOPIC OVARIAN CYSTECTOMY    . 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     10 years ago  . WISDOM TOOTH EXTRACTION       Current Outpatient Medications  Medication Sig Dispense Refill  . Cholecalciferol (VITAMIN D PO) Take by mouth.    . Fexofenadine HCl (ALLEGRA PO) Take by mouth.    . Multiple Vitamin (MULITIVITAMIN WITH MINERALS) TABS Take 1 tablet by mouth  daily.     No current facility-administered medications for this visit.     Allergies:   Floxin [ofloxacin], Metronidazole, and Sulfur    Social History:  The patient  reports that she has never smoked. She has never used smokeless tobacco. She reports that she does not drink alcohol or use drugs.   Family History:  The patient's family history includes Cancer in her father; Diabetes in her father; Hyperlipidemia in her father and mother; Hypertension in her father and mother.    ROS:  Please see the history of present illness.   Otherwise, review of systems are positive for none.   All other systems are reviewed and negative.    PHYSICAL EXAM: VS:  BP (!) 156/85 Comment: be came dixxy when she was laid flat on her back  Pulse 73   Temp (!) 97.2 F (36.2 C)   Ht 5\' 7"  (1.702 m)   Wt 233 lb (105.7 kg)   SpO2 98%   BMI 36.49 kg/m  , BMI Body mass index is 36.49 kg/m. GENERAL:  Well appearing HEENT:  Pupils equal round and reactive, fundi not visualized, oral mucosa unremarkable NECK:  No  jugular venous distention, waveform within normal limits, carotid upstroke brisk and symmetric, no bruits, no thyromegaly LYMPHATICS:  No cervical, inguinal adenopathy LUNGS:  Clear to auscultation bilaterally BACK:  No CVA tenderness CHEST:  Unremarkable HEART:  PMI not displaced or sustained,S1 and S2 within normal limits, no S3, no S4, no clicks, no rubs, no murmurs ABD:  Flat, positive bowel sounds normal in frequency in pitch, no bruits, no rebound, no guarding, no midline pulsatile mass, no hepatomegaly, no splenomegaly EXT:  2 plus pulses throughout, no edema, no cyanosis no clubbing SKIN:  No rashes no nodules NEURO:  Cranial nerves II through XII grossly intact, motor grossly intact throughout PSYCH:  Cognitively intact, oriented to person place and time    EKG:  EKG is ordered today. The ekg ordered today demonstrates sinus rhythm, rate 73, axis within normal limits, intervals  within normal limits, no acute ST-T wave changes.   Recent Labs: 08/24/2018: ALT 21; BUN 13; Creatinine, Ser 0.64; Hemoglobin 11.9; Platelets 167; Potassium 3.4; Sodium 140    Lipid Panel No results found for: CHOL, TRIG, HDL, CHOLHDL, VLDL, LDLCALC, LDLDIRECT    Wt Readings from Last 3 Encounters:  03/21/19 233 lb (105.7 kg)  10/10/18 222 lb 12.8 oz (101.1 kg)  08/24/18 220 lb (99.8 kg)      Other studies Reviewed: Additional studies/ records that were reviewed today include: OB office records. Review of the above records demonstrates:  Please see elsewhere in the note.     ASSESSMENT AND PLAN:  MURMUR: I do not appreciate a heart murmur.  She had normal echo about 5 years ago.  I suspect she might of had a flow murmur.  No further work-up.  HTN: We discussed this.  She needs to keep a blood pressure diary.  She first needs weight loss, dietary restriction salt restrictions, increase physical activity.  She will need therapy if she is not at target on a blood pressure diary.   Current medicines are reviewed at length with the patient today.  The patient does not have concerns regarding medicines.  The following changes have been made:  no change  Labs/ tests ordered today include:   Orders Placed This Encounter  Procedures  . Flu Vaccine QUAD 36+ mos IM  . EKG 12-Lead     Disposition:   FU with me as needed.      Signed, Yvonne Breeding, MD  03/21/2019 6:00 PM    [place]

## 2019-03-21 ENCOUNTER — Ambulatory Visit: Payer: Federal, State, Local not specified - PPO | Admitting: Cardiology

## 2019-03-21 ENCOUNTER — Encounter: Payer: Self-pay | Admitting: Cardiology

## 2019-03-21 ENCOUNTER — Other Ambulatory Visit: Payer: Self-pay

## 2019-03-21 VITALS — BP 156/85 | HR 73 | Temp 97.2°F | Ht 67.0 in | Wt 233.0 lb

## 2019-03-21 DIAGNOSIS — Z23 Encounter for immunization: Secondary | ICD-10-CM | POA: Diagnosis not present

## 2019-03-21 DIAGNOSIS — R011 Cardiac murmur, unspecified: Secondary | ICD-10-CM

## 2019-03-21 NOTE — Patient Instructions (Signed)
Medication Instructions:  Your physician recommends that you continue on your current medications as directed. Please refer to the Current Medication list given to you today.  If you need a refill on your cardiac medications before your next appointment, please call your pharmacy.   Lab work: NONE  Testing/Procedures: NONE  Follow-Up: At [facility], you and your health needs are our priority.  As part of our continuing mission to provide you with exceptional heart care, we have created designated Provider Care Teams.  These Care Teams include your primary Cardiologist (physician) and Advanced Practice Providers (APPs -  Physician Assistants and Nurse Practitioners) who all work together to provide you with the care you need, when you need it. You may see Dr Percival Spanish or one of the following Advanced Practice Providers on your designated Care Team:    Rosaria Ferries, PA-C  Jory Sims, DNP, ANP  Cadence Kathlen Mody, NP   Your physician wants you to follow-up as needed.

## 2019-05-11 DIAGNOSIS — B373 Candidiasis of vulva and vagina: Secondary | ICD-10-CM | POA: Diagnosis not present

## 2019-05-11 DIAGNOSIS — R350 Frequency of micturition: Secondary | ICD-10-CM | POA: Diagnosis not present

## 2019-05-14 DIAGNOSIS — K7460 Unspecified cirrhosis of liver: Secondary | ICD-10-CM | POA: Diagnosis not present

## 2019-05-14 DIAGNOSIS — D869 Sarcoidosis, unspecified: Secondary | ICD-10-CM | POA: Diagnosis not present

## 2019-05-14 DIAGNOSIS — K766 Portal hypertension: Secondary | ICD-10-CM | POA: Diagnosis not present

## 2019-05-14 DIAGNOSIS — K7469 Other cirrhosis of liver: Secondary | ICD-10-CM | POA: Diagnosis not present

## 2019-05-14 DIAGNOSIS — Z95828 Presence of other vascular implants and grafts: Secondary | ICD-10-CM | POA: Diagnosis not present

## 2019-05-20 DIAGNOSIS — Z95828 Presence of other vascular implants and grafts: Secondary | ICD-10-CM | POA: Diagnosis not present

## 2019-06-06 DIAGNOSIS — Z95828 Presence of other vascular implants and grafts: Secondary | ICD-10-CM | POA: Diagnosis not present

## 2019-06-06 DIAGNOSIS — Z4589 Encounter for adjustment and management of other implanted devices: Secondary | ICD-10-CM | POA: Diagnosis not present

## 2019-06-20 DIAGNOSIS — E559 Vitamin D deficiency, unspecified: Secondary | ICD-10-CM | POA: Diagnosis not present

## 2019-06-20 DIAGNOSIS — E7800 Pure hypercholesterolemia, unspecified: Secondary | ICD-10-CM | POA: Diagnosis not present

## 2019-06-20 DIAGNOSIS — B354 Tinea corporis: Secondary | ICD-10-CM | POA: Diagnosis not present

## 2019-06-20 DIAGNOSIS — D72819 Decreased white blood cell count, unspecified: Secondary | ICD-10-CM | POA: Diagnosis not present

## 2019-06-20 DIAGNOSIS — D649 Anemia, unspecified: Secondary | ICD-10-CM | POA: Diagnosis not present

## 2019-06-20 DIAGNOSIS — M545 Low back pain: Secondary | ICD-10-CM | POA: Diagnosis not present

## 2019-07-31 DIAGNOSIS — Z0000 Encounter for general adult medical examination without abnormal findings: Secondary | ICD-10-CM | POA: Diagnosis not present

## 2019-08-13 DIAGNOSIS — L304 Erythema intertrigo: Secondary | ICD-10-CM | POA: Diagnosis not present

## 2019-08-13 DIAGNOSIS — L918 Other hypertrophic disorders of the skin: Secondary | ICD-10-CM | POA: Diagnosis not present

## 2019-08-14 DIAGNOSIS — S058X2A Other injuries of left eye and orbit, initial encounter: Secondary | ICD-10-CM | POA: Diagnosis not present

## 2019-09-02 DIAGNOSIS — Z23 Encounter for immunization: Secondary | ICD-10-CM | POA: Diagnosis not present

## 2019-10-23 DIAGNOSIS — Z1231 Encounter for screening mammogram for malignant neoplasm of breast: Secondary | ICD-10-CM | POA: Diagnosis not present

## 2019-11-04 DIAGNOSIS — Z23 Encounter for immunization: Secondary | ICD-10-CM | POA: Diagnosis not present

## 2019-11-10 DIAGNOSIS — D8689 Sarcoidosis of other sites: Secondary | ICD-10-CM | POA: Diagnosis not present

## 2019-11-10 DIAGNOSIS — E876 Hypokalemia: Secondary | ICD-10-CM | POA: Diagnosis not present

## 2019-11-10 DIAGNOSIS — K7469 Other cirrhosis of liver: Secondary | ICD-10-CM | POA: Diagnosis not present

## 2019-11-10 DIAGNOSIS — Z79899 Other long term (current) drug therapy: Secondary | ICD-10-CM | POA: Diagnosis not present

## 2019-11-18 DIAGNOSIS — K7469 Other cirrhosis of liver: Secondary | ICD-10-CM | POA: Diagnosis not present

## 2020-01-07 DIAGNOSIS — R040 Epistaxis: Secondary | ICD-10-CM | POA: Diagnosis not present

## 2020-01-07 DIAGNOSIS — J029 Acute pharyngitis, unspecified: Secondary | ICD-10-CM | POA: Diagnosis not present

## 2020-01-08 DIAGNOSIS — Z20822 Contact with and (suspected) exposure to covid-19: Secondary | ICD-10-CM | POA: Diagnosis not present

## 2020-01-08 DIAGNOSIS — J029 Acute pharyngitis, unspecified: Secondary | ICD-10-CM | POA: Diagnosis not present

## 2020-02-24 DIAGNOSIS — M791 Myalgia, unspecified site: Secondary | ICD-10-CM | POA: Diagnosis not present

## 2020-02-24 DIAGNOSIS — N3000 Acute cystitis without hematuria: Secondary | ICD-10-CM | POA: Diagnosis not present

## 2020-02-24 DIAGNOSIS — G4489 Other headache syndrome: Secondary | ICD-10-CM | POA: Diagnosis not present

## 2020-02-24 DIAGNOSIS — M545 Low back pain, unspecified: Secondary | ICD-10-CM | POA: Diagnosis not present

## 2020-02-27 DIAGNOSIS — Z03818 Encounter for observation for suspected exposure to other biological agents ruled out: Secondary | ICD-10-CM | POA: Diagnosis not present

## 2020-03-03 DIAGNOSIS — Z1239 Encounter for other screening for malignant neoplasm of breast: Secondary | ICD-10-CM | POA: Diagnosis not present

## 2020-03-03 DIAGNOSIS — R011 Cardiac murmur, unspecified: Secondary | ICD-10-CM | POA: Diagnosis not present

## 2020-03-03 DIAGNOSIS — M791 Myalgia, unspecified site: Secondary | ICD-10-CM | POA: Diagnosis not present

## 2020-03-03 DIAGNOSIS — Z1211 Encounter for screening for malignant neoplasm of colon: Secondary | ICD-10-CM | POA: Diagnosis not present

## 2020-03-03 DIAGNOSIS — Z01419 Encounter for gynecological examination (general) (routine) without abnormal findings: Secondary | ICD-10-CM | POA: Diagnosis not present

## 2020-04-27 DIAGNOSIS — N644 Mastodynia: Secondary | ICD-10-CM | POA: Diagnosis not present

## 2020-04-27 DIAGNOSIS — M940 Chondrocostal junction syndrome [Tietze]: Secondary | ICD-10-CM | POA: Diagnosis not present

## 2020-05-12 DIAGNOSIS — Z95828 Presence of other vascular implants and grafts: Secondary | ICD-10-CM | POA: Diagnosis not present

## 2020-05-12 DIAGNOSIS — K7469 Other cirrhosis of liver: Secondary | ICD-10-CM | POA: Diagnosis not present

## 2020-05-12 DIAGNOSIS — I728 Aneurysm of other specified arteries: Secondary | ICD-10-CM | POA: Diagnosis not present

## 2020-05-24 DIAGNOSIS — M545 Low back pain, unspecified: Secondary | ICD-10-CM | POA: Diagnosis not present

## 2020-05-24 DIAGNOSIS — M549 Dorsalgia, unspecified: Secondary | ICD-10-CM | POA: Insufficient documentation

## 2020-07-06 DIAGNOSIS — R300 Dysuria: Secondary | ICD-10-CM | POA: Diagnosis not present

## 2020-07-06 DIAGNOSIS — R030 Elevated blood-pressure reading, without diagnosis of hypertension: Secondary | ICD-10-CM | POA: Diagnosis not present

## 2020-07-06 DIAGNOSIS — R109 Unspecified abdominal pain: Secondary | ICD-10-CM | POA: Diagnosis not present

## 2020-08-09 ENCOUNTER — Encounter (HOSPITAL_BASED_OUTPATIENT_CLINIC_OR_DEPARTMENT_OTHER): Payer: Self-pay

## 2020-08-09 ENCOUNTER — Emergency Department (HOSPITAL_BASED_OUTPATIENT_CLINIC_OR_DEPARTMENT_OTHER): Payer: Federal, State, Local not specified - PPO

## 2020-08-09 ENCOUNTER — Other Ambulatory Visit: Payer: Self-pay

## 2020-08-09 ENCOUNTER — Emergency Department (HOSPITAL_BASED_OUTPATIENT_CLINIC_OR_DEPARTMENT_OTHER)
Admission: EM | Admit: 2020-08-09 | Discharge: 2020-08-09 | Disposition: A | Discharge status: Home / Self Care | Payer: Federal, State, Local not specified - PPO | Attending: Emergency Medicine | Admitting: Emergency Medicine

## 2020-08-09 DIAGNOSIS — S82831A Other fracture of upper and lower end of right fibula, initial encounter for closed fracture: Secondary | ICD-10-CM | POA: Diagnosis not present

## 2020-08-09 DIAGNOSIS — S92154A Nondisplaced avulsion fracture (chip fracture) of right talus, initial encounter for closed fracture: Secondary | ICD-10-CM | POA: Diagnosis not present

## 2020-08-09 DIAGNOSIS — I10 Essential (primary) hypertension: Secondary | ICD-10-CM | POA: Insufficient documentation

## 2020-08-09 DIAGNOSIS — S82832A Other fracture of upper and lower end of left fibula, initial encounter for closed fracture: Secondary | ICD-10-CM | POA: Diagnosis not present

## 2020-08-09 DIAGNOSIS — S99911A Unspecified injury of right ankle, initial encounter: Secondary | ICD-10-CM | POA: Diagnosis not present

## 2020-08-09 DIAGNOSIS — S92124A Nondisplaced fracture of body of right talus, initial encounter for closed fracture: Secondary | ICD-10-CM | POA: Diagnosis not present

## 2020-08-09 DIAGNOSIS — Y9301 Activity, walking, marching and hiking: Secondary | ICD-10-CM | POA: Diagnosis not present

## 2020-08-09 DIAGNOSIS — W109XXA Fall (on) (from) unspecified stairs and steps, initial encounter: Secondary | ICD-10-CM | POA: Diagnosis not present

## 2020-08-09 DIAGNOSIS — S8392XA Sprain of unspecified site of left knee, initial encounter: Secondary | ICD-10-CM

## 2020-08-09 DIAGNOSIS — S8391XA Sprain of unspecified site of right knee, initial encounter: Secondary | ICD-10-CM

## 2020-08-09 DIAGNOSIS — M7989 Other specified soft tissue disorders: Secondary | ICD-10-CM | POA: Diagnosis not present

## 2020-08-09 DIAGNOSIS — Y9222 Religious institution as the place of occurrence of the external cause: Secondary | ICD-10-CM | POA: Diagnosis not present

## 2020-08-09 DIAGNOSIS — S82202A Unspecified fracture of shaft of left tibia, initial encounter for closed fracture: Secondary | ICD-10-CM | POA: Diagnosis not present

## 2020-08-09 NOTE — ED Triage Notes (Addendum)
Pt was walking up the stairs yesterday morning when tripped, and fell. Pt c/o bilateral leg and knee pain, left side worse than right. Pt is just now coming to the ED because she is having a hard time getting up and walking around. Denies LOC or hitting her head.   Attempted to ice and took tylenol last night at 2100 with no relief of sx.

## 2020-08-09 NOTE — ED Notes (Signed)
Patient transported to X-ray 

## 2020-08-09 NOTE — ED Provider Notes (Signed)
[facility] Provider Note: Georgena Spurling, MD, FACEP  CSN: [id number] MRN: [id number] ARRIVAL: 08/09/20 at [place]: [place]  Fall   HISTORY OF PRESENT ILLNESS  08/09/20 4:22 AM Yvonne Chapman is a 60 y.o. female was walking up some stairs at church yesterday morning and tripped and fell.  She injured her bilateral ankles and knees, left worse than right.  She rates her pain as a 7 out of 10 at its worst, aching in nature.  It is worse with ambulation.  She has had a hard time getting up and walking around due to worsening pain.  She has Tylenol and applied ice without relief of her pain.  She did not hit her head or lose consciousness during the fall.    Past Medical History:  Diagnosis Date  . Abnormal uterine bleeding   . Anemia   . Back pain   . Bacterial vaginosis   . Dysmenorrhea   . Endometriosis   . Fatty liver   . Fibroids   . GERD (gastroesophageal reflux disease)   . Gonorrhea   . Herpes   . History of chicken pox   . Hypertension   . Lactose intolerance   . 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 Monilia infection   . Ovarian cyst   . Sarcoidosis     Past Surgical History:  Procedure Laterality Date  . ABDOMINAL HYSTERECTOMY    . DILATION AND CURETTAGE OF UTERUS    . FOOT SURGERY    . LAPAROSCOPIC OVARIAN CYSTECTOMY    . 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     10 years ago  . WISDOM TOOTH EXTRACTION      Family History  Problem Relation Age of Onset  . Hypertension Mother   . Hyperlipidemia Mother   . Hypertension Father   . Diabetes Father   . Hyperlipidemia Father   . Cancer Father     Social History   Tobacco Use  . Smoking status: Never Smoker  . Smokeless tobacco: Never Used  Substance Use Topics  . Alcohol use: No  . Drug use: No    Prior to Admission medications   Medication Sig Start Date End Date Taking? Authorizing Provider  Cholecalciferol (VITAMIN D PO) Take by mouth.    [provider]  Fexofenadine  HCl (ALLEGRA PO) Take by mouth.    [provider]  Multiple Vitamin (MULITIVITAMIN WITH MINERALS) TABS Take 1 tablet by mouth daily.    [provider]    Allergies Elemental sulfur, Floxin [ofloxacin], and Metronidazole   REVIEW OF SYSTEMS  Negative except as noted here or in the History of Present Illness.   PHYSICAL EXAMINATION  Initial Vital Signs Blood pressure (!) 153/72, pulse 76, temperature 98.1 F (36.7 C), temperature source Oral, resp. rate 18, height 5\' 7"  (1.702 m), weight 101.8 kg, SpO2 100 %.  Examination General: Well-developed, well-nourished female in no acute distress; appearance consistent with age of record HENT: normocephalic; atraumatic Eyes: pupils equal, round and reactive to light; extraocular muscles intact Neck: supple Heart: regular rate and rhythm Lungs: clear to auscultation bilaterally Abdomen: soft; nondistended; nontender; bowel sounds present Extremities: No deformity; pulses normal; tenderness of knees and ankles, left greater than right Neurologic: Awake, alert and oriented; motor function intact in all extremities and symmetric; no facial droop Skin: Warm and dry Psychiatric: Normal mood and affect   RESULTS  Summary of this visit's results, reviewed and interpreted by  myself:   EKG Interpretation  Date/Time:    Ventricular Rate:    PR Interval:    QRS Duration:   QT Interval:    QTC Calculation:   R Axis:     Text Interpretation:        Laboratory Studies: No results found for this or any previous visit (from the past 24 hour(s)). Imaging Studies: DG Ankle Complete Left  Result Date: 08/09/2020 CLINICAL DATA:  60 year old female status post trip and fall on stairs yesterday. Persistent pain. EXAM: LEFT ANKLE COMPLETE - 3+ VIEW COMPARISON:  Left ankle series 04/28/2013. FINDINGS: Moderate to severe soft tissue swelling most pronounced laterally and anteriorly. No definite joint effusion. Nondisplaced  fracture of the distal left fibula at the metadiaphysis with subtle oblique fracture line. Maintained mortise joint alignment. Talar dome intact. Distal tibia appears intact. Intact calcaneus with new plantar degenerative spurring since 2014. Grossly intact other visible bones of the left foot. IMPRESSION: 1. Nondisplaced fracture of the distal left fibula metadiaphysis. Moderate to severe soft tissue swelling. 2. No other acute fracture or dislocation identified about the left ankle. Electronically Signed   By: Genevie Ann M.D.   On: 08/09/2020 05:26   DG Ankle Complete Right  Result Date: 08/09/2020 CLINICAL DATA:  60 year old female status post trip and fall on stairs yesterday. Persistent pain. EXAM: RIGHT ANKLE - COMPLETE 3+ VIEW COMPARISON:  Right ankle series 04/28/2013. FINDINGS: Moderate anterior and lateral soft tissue swelling. Mortise joint alignment maintained. Talar dome intact. No definite joint effusion. Distal tibia appears intact. Subtle avulsion fractures at both the tip of the lateral malleolus and anterior talus suspected (arrows). Increased calcaneus spurring since 2014. Calcaneus and other visible bones of the right foot appear stable and intact. IMPRESSION: 1. Subtle avulsion fractures suspected at both the tip of the lateral malleolus and the anterior talus. Moderate soft tissue swelling. 2. No other acute fracture or dislocation identified about the right ankle. Electronically Signed   By: Genevie Ann M.D.   On: 08/09/2020 05:28   DG Knee Complete 4 Views Left  Result Date: 08/09/2020 CLINICAL DATA:  60 year old female status post trip and fall on stairs yesterday. Persistent pain. EXAM: LEFT KNEE - COMPLETE 4+ VIEW COMPARISON:  Report of left knee series 10/23/2000 (no images available). FINDINGS: Bone mineralization is within normal limits. No evidence of fracture, dislocation, or joint effusion. Minimal medial compartment degenerative spurring. Relatively preserved joint spaces. Anterior  soft tissue swelling at the proximal tib fib. IMPRESSION: Anterior soft tissue injury at the proximal tib-fib. No acute fracture or dislocation identified about the left knee. Electronically Signed   By: Genevie Ann M.D.   On: 08/09/2020 05:23   DG Knee Complete 4 Views Right  Result Date: 08/09/2020 CLINICAL DATA:  60 year old female status post trip and fall on stairs yesterday. Persistent pain. EXAM: RIGHT KNEE - COMPLETE 4+ VIEW COMPARISON:  Right knee series 06/02/2015. FINDINGS: Stable bone mineralization. No evidence of joint effusion. Chronic up to moderate medial compartment degenerative spurring but relatively preserved joint spaces, stable since 2017. No acute osseous abnormality identified. Anterior tib fib soft tissue swelling. IMPRESSION: Anterior soft tissue swelling. Chronic medial compartment degenerative spurring. No acute fracture or dislocation identified about the right knee. Electronically Signed   By: Genevie Ann M.D.   On: 08/09/2020 05:25    ED COURSE and MDM  Nursing notes, initial and subsequent vitals signs, including pulse oximetry, reviewed and interpreted by myself.  Vitals:   08/09/20 0418 08/09/20  0419 08/09/20 0430  BP: (!) 153/72  (!) 145/69  Pulse: 76  74  Resp: 18  17  Temp: 98.1 F (36.7 C)    TempSrc: Oral    SpO2: 100%  98%  Weight:  101.8 kg   Height:  5\' 7"  (1.702 m)    Medications - No data to display  5:42 AM Patient's left ankle put in a cam walker.  Right ankle put in an ASO.  Patient was encouraged to remain nonweightbearing as much as possible as she may require surgery on the left ankle.  PROCEDURES  Procedures   ED DIAGNOSES     ICD-10-CM   1. Fall on stairs, initial encounter  W10.9XXA   2. Other closed fracture of distal end of left fibula, initial encounter  S82.832A   3. Closed avulsion fracture of distal end of right fibula, initial encounter  S82.831A   4. Closed nondisplaced avulsion fracture of right talus, initial encounter   S92.154A   5. Sprain of left knee, unspecified ligament, initial encounter  S83.92XA   6. Sprain of right knee, unspecified ligament, initial encounter  S83.91XA        Riel Hirschman, Jenny Reichmann, MD 08/09/20 [phone]

## 2020-08-12 DIAGNOSIS — Z0000 Encounter for general adult medical examination without abnormal findings: Secondary | ICD-10-CM | POA: Diagnosis not present

## 2020-08-12 DIAGNOSIS — E559 Vitamin D deficiency, unspecified: Secondary | ICD-10-CM | POA: Diagnosis not present

## 2020-08-12 DIAGNOSIS — E7800 Pure hypercholesterolemia, unspecified: Secondary | ICD-10-CM | POA: Diagnosis not present

## 2020-08-19 DIAGNOSIS — G8918 Other acute postprocedural pain: Secondary | ICD-10-CM | POA: Diagnosis not present

## 2020-08-19 DIAGNOSIS — Y999 Unspecified external cause status: Secondary | ICD-10-CM | POA: Diagnosis not present

## 2020-08-19 DIAGNOSIS — X58XXXA Exposure to other specified factors, initial encounter: Secondary | ICD-10-CM | POA: Diagnosis not present

## 2020-08-19 DIAGNOSIS — S82842A Displaced bimalleolar fracture of left lower leg, initial encounter for closed fracture: Secondary | ICD-10-CM | POA: Diagnosis not present

## 2020-08-22 DIAGNOSIS — S82842D Displaced bimalleolar fracture of left lower leg, subsequent encounter for closed fracture with routine healing: Secondary | ICD-10-CM | POA: Diagnosis not present

## 2020-08-27 DIAGNOSIS — S93401D Sprain of unspecified ligament of right ankle, subsequent encounter: Secondary | ICD-10-CM | POA: Diagnosis not present

## 2020-08-27 DIAGNOSIS — S82842D Displaced bimalleolar fracture of left lower leg, subsequent encounter for closed fracture with routine healing: Secondary | ICD-10-CM | POA: Diagnosis not present

## 2020-08-27 DIAGNOSIS — D869 Sarcoidosis, unspecified: Secondary | ICD-10-CM | POA: Diagnosis not present

## 2020-08-27 DIAGNOSIS — Z4789 Encounter for other orthopedic aftercare: Secondary | ICD-10-CM | POA: Diagnosis not present

## 2020-08-27 DIAGNOSIS — Z7982 Long term (current) use of aspirin: Secondary | ICD-10-CM | POA: Diagnosis not present

## 2020-08-27 DIAGNOSIS — Z9181 History of falling: Secondary | ICD-10-CM | POA: Diagnosis not present

## 2020-08-30 DIAGNOSIS — M25571 Pain in right ankle and joints of right foot: Secondary | ICD-10-CM | POA: Diagnosis not present

## 2020-08-30 DIAGNOSIS — S82842D Displaced bimalleolar fracture of left lower leg, subsequent encounter for closed fracture with routine healing: Secondary | ICD-10-CM | POA: Diagnosis not present

## 2020-09-01 DIAGNOSIS — Z7982 Long term (current) use of aspirin: Secondary | ICD-10-CM | POA: Diagnosis not present

## 2020-09-01 DIAGNOSIS — Z4789 Encounter for other orthopedic aftercare: Secondary | ICD-10-CM | POA: Diagnosis not present

## 2020-09-01 DIAGNOSIS — Z9181 History of falling: Secondary | ICD-10-CM | POA: Diagnosis not present

## 2020-09-01 DIAGNOSIS — S93401D Sprain of unspecified ligament of right ankle, subsequent encounter: Secondary | ICD-10-CM | POA: Diagnosis not present

## 2020-09-01 DIAGNOSIS — D869 Sarcoidosis, unspecified: Secondary | ICD-10-CM | POA: Diagnosis not present

## 2020-09-01 DIAGNOSIS — S82842D Displaced bimalleolar fracture of left lower leg, subsequent encounter for closed fracture with routine healing: Secondary | ICD-10-CM | POA: Diagnosis not present

## 2020-09-02 DIAGNOSIS — S93401D Sprain of unspecified ligament of right ankle, subsequent encounter: Secondary | ICD-10-CM | POA: Diagnosis not present

## 2020-09-02 DIAGNOSIS — D869 Sarcoidosis, unspecified: Secondary | ICD-10-CM | POA: Diagnosis not present

## 2020-09-02 DIAGNOSIS — Z4789 Encounter for other orthopedic aftercare: Secondary | ICD-10-CM | POA: Diagnosis not present

## 2020-09-02 DIAGNOSIS — Z7982 Long term (current) use of aspirin: Secondary | ICD-10-CM | POA: Diagnosis not present

## 2020-09-02 DIAGNOSIS — S82842D Displaced bimalleolar fracture of left lower leg, subsequent encounter for closed fracture with routine healing: Secondary | ICD-10-CM | POA: Diagnosis not present

## 2020-09-02 DIAGNOSIS — Z9181 History of falling: Secondary | ICD-10-CM | POA: Diagnosis not present

## 2020-09-06 DIAGNOSIS — S82842D Displaced bimalleolar fracture of left lower leg, subsequent encounter for closed fracture with routine healing: Secondary | ICD-10-CM | POA: Diagnosis not present

## 2020-09-06 DIAGNOSIS — Z7982 Long term (current) use of aspirin: Secondary | ICD-10-CM | POA: Diagnosis not present

## 2020-09-06 DIAGNOSIS — D869 Sarcoidosis, unspecified: Secondary | ICD-10-CM | POA: Diagnosis not present

## 2020-09-06 DIAGNOSIS — S93401D Sprain of unspecified ligament of right ankle, subsequent encounter: Secondary | ICD-10-CM | POA: Diagnosis not present

## 2020-09-06 DIAGNOSIS — Z4789 Encounter for other orthopedic aftercare: Secondary | ICD-10-CM | POA: Diagnosis not present

## 2020-09-06 DIAGNOSIS — Z9181 History of falling: Secondary | ICD-10-CM | POA: Diagnosis not present

## 2020-09-07 DIAGNOSIS — S82842D Displaced bimalleolar fracture of left lower leg, subsequent encounter for closed fracture with routine healing: Secondary | ICD-10-CM | POA: Diagnosis not present

## 2020-09-08 DIAGNOSIS — Z7982 Long term (current) use of aspirin: Secondary | ICD-10-CM | POA: Diagnosis not present

## 2020-09-08 DIAGNOSIS — S93401D Sprain of unspecified ligament of right ankle, subsequent encounter: Secondary | ICD-10-CM | POA: Diagnosis not present

## 2020-09-08 DIAGNOSIS — S82842D Displaced bimalleolar fracture of left lower leg, subsequent encounter for closed fracture with routine healing: Secondary | ICD-10-CM | POA: Diagnosis not present

## 2020-09-08 DIAGNOSIS — Z9181 History of falling: Secondary | ICD-10-CM | POA: Diagnosis not present

## 2020-09-08 DIAGNOSIS — Z4789 Encounter for other orthopedic aftercare: Secondary | ICD-10-CM | POA: Diagnosis not present

## 2020-09-08 DIAGNOSIS — D869 Sarcoidosis, unspecified: Secondary | ICD-10-CM | POA: Diagnosis not present

## 2020-09-10 DIAGNOSIS — Z9181 History of falling: Secondary | ICD-10-CM | POA: Diagnosis not present

## 2020-09-10 DIAGNOSIS — S82842D Displaced bimalleolar fracture of left lower leg, subsequent encounter for closed fracture with routine healing: Secondary | ICD-10-CM | POA: Diagnosis not present

## 2020-09-10 DIAGNOSIS — Z4789 Encounter for other orthopedic aftercare: Secondary | ICD-10-CM | POA: Diagnosis not present

## 2020-09-10 DIAGNOSIS — D869 Sarcoidosis, unspecified: Secondary | ICD-10-CM | POA: Diagnosis not present

## 2020-09-10 DIAGNOSIS — S93401D Sprain of unspecified ligament of right ankle, subsequent encounter: Secondary | ICD-10-CM | POA: Diagnosis not present

## 2020-09-10 DIAGNOSIS — Z7982 Long term (current) use of aspirin: Secondary | ICD-10-CM | POA: Diagnosis not present

## 2020-09-13 DIAGNOSIS — Z7982 Long term (current) use of aspirin: Secondary | ICD-10-CM | POA: Diagnosis not present

## 2020-09-13 DIAGNOSIS — Z4789 Encounter for other orthopedic aftercare: Secondary | ICD-10-CM | POA: Diagnosis not present

## 2020-09-13 DIAGNOSIS — S82842D Displaced bimalleolar fracture of left lower leg, subsequent encounter for closed fracture with routine healing: Secondary | ICD-10-CM | POA: Diagnosis not present

## 2020-09-13 DIAGNOSIS — S93401D Sprain of unspecified ligament of right ankle, subsequent encounter: Secondary | ICD-10-CM | POA: Diagnosis not present

## 2020-09-13 DIAGNOSIS — Z9181 History of falling: Secondary | ICD-10-CM | POA: Diagnosis not present

## 2020-09-13 DIAGNOSIS — D869 Sarcoidosis, unspecified: Secondary | ICD-10-CM | POA: Diagnosis not present

## 2020-09-15 DIAGNOSIS — S82842D Displaced bimalleolar fracture of left lower leg, subsequent encounter for closed fracture with routine healing: Secondary | ICD-10-CM | POA: Diagnosis not present

## 2020-09-15 DIAGNOSIS — Z9181 History of falling: Secondary | ICD-10-CM | POA: Diagnosis not present

## 2020-09-15 DIAGNOSIS — S93401D Sprain of unspecified ligament of right ankle, subsequent encounter: Secondary | ICD-10-CM | POA: Diagnosis not present

## 2020-09-15 DIAGNOSIS — Z4789 Encounter for other orthopedic aftercare: Secondary | ICD-10-CM | POA: Diagnosis not present

## 2020-09-15 DIAGNOSIS — Z7982 Long term (current) use of aspirin: Secondary | ICD-10-CM | POA: Diagnosis not present

## 2020-09-15 DIAGNOSIS — D869 Sarcoidosis, unspecified: Secondary | ICD-10-CM | POA: Diagnosis not present

## 2020-09-17 DIAGNOSIS — D869 Sarcoidosis, unspecified: Secondary | ICD-10-CM | POA: Diagnosis not present

## 2020-09-17 DIAGNOSIS — S82842D Displaced bimalleolar fracture of left lower leg, subsequent encounter for closed fracture with routine healing: Secondary | ICD-10-CM | POA: Diagnosis not present

## 2020-09-17 DIAGNOSIS — Z9181 History of falling: Secondary | ICD-10-CM | POA: Diagnosis not present

## 2020-09-17 DIAGNOSIS — Z7982 Long term (current) use of aspirin: Secondary | ICD-10-CM | POA: Diagnosis not present

## 2020-09-17 DIAGNOSIS — Z4789 Encounter for other orthopedic aftercare: Secondary | ICD-10-CM | POA: Diagnosis not present

## 2020-09-17 DIAGNOSIS — S93401D Sprain of unspecified ligament of right ankle, subsequent encounter: Secondary | ICD-10-CM | POA: Diagnosis not present

## 2020-09-20 DIAGNOSIS — D869 Sarcoidosis, unspecified: Secondary | ICD-10-CM | POA: Diagnosis not present

## 2020-09-20 DIAGNOSIS — Z4789 Encounter for other orthopedic aftercare: Secondary | ICD-10-CM | POA: Diagnosis not present

## 2020-09-20 DIAGNOSIS — Z7982 Long term (current) use of aspirin: Secondary | ICD-10-CM | POA: Diagnosis not present

## 2020-09-20 DIAGNOSIS — S93401D Sprain of unspecified ligament of right ankle, subsequent encounter: Secondary | ICD-10-CM | POA: Diagnosis not present

## 2020-09-20 DIAGNOSIS — S82842D Displaced bimalleolar fracture of left lower leg, subsequent encounter for closed fracture with routine healing: Secondary | ICD-10-CM | POA: Diagnosis not present

## 2020-09-20 DIAGNOSIS — Z9181 History of falling: Secondary | ICD-10-CM | POA: Diagnosis not present

## 2020-09-22 DIAGNOSIS — S82842D Displaced bimalleolar fracture of left lower leg, subsequent encounter for closed fracture with routine healing: Secondary | ICD-10-CM | POA: Diagnosis not present

## 2020-09-22 DIAGNOSIS — Z4789 Encounter for other orthopedic aftercare: Secondary | ICD-10-CM | POA: Diagnosis not present

## 2020-09-22 DIAGNOSIS — S93401D Sprain of unspecified ligament of right ankle, subsequent encounter: Secondary | ICD-10-CM | POA: Diagnosis not present

## 2020-09-22 DIAGNOSIS — D869 Sarcoidosis, unspecified: Secondary | ICD-10-CM | POA: Diagnosis not present

## 2020-09-22 DIAGNOSIS — Z9181 History of falling: Secondary | ICD-10-CM | POA: Diagnosis not present

## 2020-09-22 DIAGNOSIS — Z7982 Long term (current) use of aspirin: Secondary | ICD-10-CM | POA: Diagnosis not present

## 2020-09-27 DIAGNOSIS — S93401D Sprain of unspecified ligament of right ankle, subsequent encounter: Secondary | ICD-10-CM | POA: Diagnosis not present

## 2020-09-27 DIAGNOSIS — S82842D Displaced bimalleolar fracture of left lower leg, subsequent encounter for closed fracture with routine healing: Secondary | ICD-10-CM | POA: Diagnosis not present

## 2020-09-27 DIAGNOSIS — Z9181 History of falling: Secondary | ICD-10-CM | POA: Diagnosis not present

## 2020-09-27 DIAGNOSIS — D869 Sarcoidosis, unspecified: Secondary | ICD-10-CM | POA: Diagnosis not present

## 2020-09-27 DIAGNOSIS — Z7982 Long term (current) use of aspirin: Secondary | ICD-10-CM | POA: Diagnosis not present

## 2020-09-27 DIAGNOSIS — Z4789 Encounter for other orthopedic aftercare: Secondary | ICD-10-CM | POA: Diagnosis not present

## 2020-09-28 DIAGNOSIS — S82842D Displaced bimalleolar fracture of left lower leg, subsequent encounter for closed fracture with routine healing: Secondary | ICD-10-CM | POA: Diagnosis not present

## 2020-09-28 DIAGNOSIS — Z4789 Encounter for other orthopedic aftercare: Secondary | ICD-10-CM | POA: Diagnosis not present

## 2020-09-28 DIAGNOSIS — Z9181 History of falling: Secondary | ICD-10-CM | POA: Diagnosis not present

## 2020-09-28 DIAGNOSIS — S93401D Sprain of unspecified ligament of right ankle, subsequent encounter: Secondary | ICD-10-CM | POA: Diagnosis not present

## 2020-09-28 DIAGNOSIS — Z7982 Long term (current) use of aspirin: Secondary | ICD-10-CM | POA: Diagnosis not present

## 2020-09-28 DIAGNOSIS — D869 Sarcoidosis, unspecified: Secondary | ICD-10-CM | POA: Diagnosis not present

## 2020-09-29 DIAGNOSIS — K7460 Unspecified cirrhosis of liver: Secondary | ICD-10-CM | POA: Diagnosis not present

## 2020-09-29 DIAGNOSIS — S82842D Displaced bimalleolar fracture of left lower leg, subsequent encounter for closed fracture with routine healing: Secondary | ICD-10-CM | POA: Diagnosis not present

## 2020-10-01 DIAGNOSIS — M6281 Muscle weakness (generalized): Secondary | ICD-10-CM | POA: Diagnosis not present

## 2020-10-01 DIAGNOSIS — M25671 Stiffness of right ankle, not elsewhere classified: Secondary | ICD-10-CM | POA: Diagnosis not present

## 2020-10-01 DIAGNOSIS — M25672 Stiffness of left ankle, not elsewhere classified: Secondary | ICD-10-CM | POA: Diagnosis not present

## 2020-10-01 DIAGNOSIS — R262 Difficulty in walking, not elsewhere classified: Secondary | ICD-10-CM | POA: Diagnosis not present

## 2020-10-06 DIAGNOSIS — M6281 Muscle weakness (generalized): Secondary | ICD-10-CM | POA: Diagnosis not present

## 2020-10-06 DIAGNOSIS — R262 Difficulty in walking, not elsewhere classified: Secondary | ICD-10-CM | POA: Diagnosis not present

## 2020-10-06 DIAGNOSIS — M25671 Stiffness of right ankle, not elsewhere classified: Secondary | ICD-10-CM | POA: Diagnosis not present

## 2020-10-06 DIAGNOSIS — M25672 Stiffness of left ankle, not elsewhere classified: Secondary | ICD-10-CM | POA: Diagnosis not present

## 2020-10-08 DIAGNOSIS — M6281 Muscle weakness (generalized): Secondary | ICD-10-CM | POA: Diagnosis not present

## 2020-10-08 DIAGNOSIS — M25671 Stiffness of right ankle, not elsewhere classified: Secondary | ICD-10-CM | POA: Diagnosis not present

## 2020-10-08 DIAGNOSIS — M25672 Stiffness of left ankle, not elsewhere classified: Secondary | ICD-10-CM | POA: Diagnosis not present

## 2020-10-08 DIAGNOSIS — R262 Difficulty in walking, not elsewhere classified: Secondary | ICD-10-CM | POA: Diagnosis not present

## 2020-10-12 DIAGNOSIS — M6281 Muscle weakness (generalized): Secondary | ICD-10-CM | POA: Diagnosis not present

## 2020-10-12 DIAGNOSIS — M25672 Stiffness of left ankle, not elsewhere classified: Secondary | ICD-10-CM | POA: Diagnosis not present

## 2020-10-12 DIAGNOSIS — M25671 Stiffness of right ankle, not elsewhere classified: Secondary | ICD-10-CM | POA: Diagnosis not present

## 2020-10-12 DIAGNOSIS — R262 Difficulty in walking, not elsewhere classified: Secondary | ICD-10-CM | POA: Diagnosis not present

## 2020-10-15 DIAGNOSIS — M25672 Stiffness of left ankle, not elsewhere classified: Secondary | ICD-10-CM | POA: Diagnosis not present

## 2020-10-15 DIAGNOSIS — R262 Difficulty in walking, not elsewhere classified: Secondary | ICD-10-CM | POA: Diagnosis not present

## 2020-10-15 DIAGNOSIS — M6281 Muscle weakness (generalized): Secondary | ICD-10-CM | POA: Diagnosis not present

## 2020-10-15 DIAGNOSIS — M25671 Stiffness of right ankle, not elsewhere classified: Secondary | ICD-10-CM | POA: Diagnosis not present

## 2020-10-22 DIAGNOSIS — M25672 Stiffness of left ankle, not elsewhere classified: Secondary | ICD-10-CM | POA: Diagnosis not present

## 2020-10-22 DIAGNOSIS — R262 Difficulty in walking, not elsewhere classified: Secondary | ICD-10-CM | POA: Diagnosis not present

## 2020-10-22 DIAGNOSIS — M25671 Stiffness of right ankle, not elsewhere classified: Secondary | ICD-10-CM | POA: Diagnosis not present

## 2020-10-22 DIAGNOSIS — M6281 Muscle weakness (generalized): Secondary | ICD-10-CM | POA: Diagnosis not present

## 2020-10-25 DIAGNOSIS — Z1231 Encounter for screening mammogram for malignant neoplasm of breast: Secondary | ICD-10-CM | POA: Diagnosis not present

## 2020-10-27 DIAGNOSIS — M25672 Stiffness of left ankle, not elsewhere classified: Secondary | ICD-10-CM | POA: Diagnosis not present

## 2020-10-27 DIAGNOSIS — M6281 Muscle weakness (generalized): Secondary | ICD-10-CM | POA: Diagnosis not present

## 2020-10-27 DIAGNOSIS — R262 Difficulty in walking, not elsewhere classified: Secondary | ICD-10-CM | POA: Diagnosis not present

## 2020-10-27 DIAGNOSIS — M25671 Stiffness of right ankle, not elsewhere classified: Secondary | ICD-10-CM | POA: Diagnosis not present

## 2020-10-29 DIAGNOSIS — R262 Difficulty in walking, not elsewhere classified: Secondary | ICD-10-CM | POA: Diagnosis not present

## 2020-10-29 DIAGNOSIS — M25672 Stiffness of left ankle, not elsewhere classified: Secondary | ICD-10-CM | POA: Diagnosis not present

## 2020-10-29 DIAGNOSIS — M25671 Stiffness of right ankle, not elsewhere classified: Secondary | ICD-10-CM | POA: Diagnosis not present

## 2020-10-29 DIAGNOSIS — M6281 Muscle weakness (generalized): Secondary | ICD-10-CM | POA: Diagnosis not present

## 2020-11-02 DIAGNOSIS — R262 Difficulty in walking, not elsewhere classified: Secondary | ICD-10-CM | POA: Diagnosis not present

## 2020-11-02 DIAGNOSIS — M6281 Muscle weakness (generalized): Secondary | ICD-10-CM | POA: Diagnosis not present

## 2020-11-02 DIAGNOSIS — M25672 Stiffness of left ankle, not elsewhere classified: Secondary | ICD-10-CM | POA: Diagnosis not present

## 2020-11-02 DIAGNOSIS — M25671 Stiffness of right ankle, not elsewhere classified: Secondary | ICD-10-CM | POA: Diagnosis not present

## 2020-11-03 DIAGNOSIS — S82842D Displaced bimalleolar fracture of left lower leg, subsequent encounter for closed fracture with routine healing: Secondary | ICD-10-CM | POA: Diagnosis not present

## 2020-11-04 DIAGNOSIS — M25672 Stiffness of left ankle, not elsewhere classified: Secondary | ICD-10-CM | POA: Diagnosis not present

## 2020-11-04 DIAGNOSIS — M25671 Stiffness of right ankle, not elsewhere classified: Secondary | ICD-10-CM | POA: Diagnosis not present

## 2020-11-04 DIAGNOSIS — M6281 Muscle weakness (generalized): Secondary | ICD-10-CM | POA: Diagnosis not present

## 2020-11-04 DIAGNOSIS — R262 Difficulty in walking, not elsewhere classified: Secondary | ICD-10-CM | POA: Diagnosis not present

## 2020-11-09 DIAGNOSIS — M6281 Muscle weakness (generalized): Secondary | ICD-10-CM | POA: Diagnosis not present

## 2020-11-09 DIAGNOSIS — M25672 Stiffness of left ankle, not elsewhere classified: Secondary | ICD-10-CM | POA: Diagnosis not present

## 2020-11-09 DIAGNOSIS — R262 Difficulty in walking, not elsewhere classified: Secondary | ICD-10-CM | POA: Diagnosis not present

## 2020-11-09 DIAGNOSIS — M25671 Stiffness of right ankle, not elsewhere classified: Secondary | ICD-10-CM | POA: Diagnosis not present

## 2020-11-17 DIAGNOSIS — K7469 Other cirrhosis of liver: Secondary | ICD-10-CM | POA: Diagnosis not present

## 2020-11-17 DIAGNOSIS — Z95828 Presence of other vascular implants and grafts: Secondary | ICD-10-CM | POA: Diagnosis not present

## 2020-11-17 DIAGNOSIS — X58XXXD Exposure to other specified factors, subsequent encounter: Secondary | ICD-10-CM | POA: Diagnosis not present

## 2020-11-17 DIAGNOSIS — D8689 Sarcoidosis of other sites: Secondary | ICD-10-CM | POA: Diagnosis not present

## 2020-11-17 DIAGNOSIS — S82892D Other fracture of left lower leg, subsequent encounter for closed fracture with routine healing: Secondary | ICD-10-CM | POA: Diagnosis not present

## 2020-11-23 DIAGNOSIS — R262 Difficulty in walking, not elsewhere classified: Secondary | ICD-10-CM | POA: Diagnosis not present

## 2020-11-23 DIAGNOSIS — M6281 Muscle weakness (generalized): Secondary | ICD-10-CM | POA: Diagnosis not present

## 2020-11-23 DIAGNOSIS — M25671 Stiffness of right ankle, not elsewhere classified: Secondary | ICD-10-CM | POA: Diagnosis not present

## 2020-11-23 DIAGNOSIS — M25672 Stiffness of left ankle, not elsewhere classified: Secondary | ICD-10-CM | POA: Diagnosis not present

## 2020-12-20 ENCOUNTER — Emergency Department (HOSPITAL_BASED_OUTPATIENT_CLINIC_OR_DEPARTMENT_OTHER): Payer: Federal, State, Local not specified - PPO

## 2020-12-20 ENCOUNTER — Encounter (HOSPITAL_BASED_OUTPATIENT_CLINIC_OR_DEPARTMENT_OTHER): Payer: Self-pay | Admitting: Emergency Medicine

## 2020-12-20 ENCOUNTER — Emergency Department (HOSPITAL_BASED_OUTPATIENT_CLINIC_OR_DEPARTMENT_OTHER)
Admission: EM | Admit: 2020-12-20 | Discharge: 2020-12-20 | Disposition: A | Discharge status: Home / Self Care | Payer: Federal, State, Local not specified - PPO | Attending: Emergency Medicine | Admitting: Emergency Medicine

## 2020-12-20 ENCOUNTER — Other Ambulatory Visit: Payer: Self-pay

## 2020-12-20 DIAGNOSIS — K7460 Unspecified cirrhosis of liver: Secondary | ICD-10-CM | POA: Insufficient documentation

## 2020-12-20 DIAGNOSIS — R109 Unspecified abdominal pain: Secondary | ICD-10-CM | POA: Diagnosis not present

## 2020-12-20 DIAGNOSIS — N390 Urinary tract infection, site not specified: Secondary | ICD-10-CM | POA: Insufficient documentation

## 2020-12-20 DIAGNOSIS — I728 Aneurysm of other specified arteries: Secondary | ICD-10-CM | POA: Diagnosis not present

## 2020-12-20 DIAGNOSIS — Z90710 Acquired absence of both cervix and uterus: Secondary | ICD-10-CM | POA: Diagnosis not present

## 2020-12-20 DIAGNOSIS — I10 Essential (primary) hypertension: Secondary | ICD-10-CM | POA: Diagnosis not present

## 2020-12-20 DIAGNOSIS — N23 Unspecified renal colic: Secondary | ICD-10-CM | POA: Insufficient documentation

## 2020-12-20 DIAGNOSIS — I700 Atherosclerosis of aorta: Secondary | ICD-10-CM | POA: Diagnosis not present

## 2020-12-20 DIAGNOSIS — N201 Calculus of ureter: Secondary | ICD-10-CM | POA: Diagnosis not present

## 2020-12-20 LAB — BASIC METABOLIC PANEL
Anion gap: 9 (ref 5–15)
BUN: 10 mg/dL (ref 6–20)
CO2: 25 mmol/L (ref 22–32)
Calcium: 8.9 mg/dL (ref 8.9–10.3)
Chloride: 108 mmol/L (ref 98–111)
Creatinine, Ser: 0.65 mg/dL (ref 0.44–1.00)
GFR, Estimated: >60 mL/min (ref >60)
Glucose, Bld: 110 mg/dL — ABNORMAL HIGH (ref 70–99)
Potassium: 3.2 mmol/L — ABNORMAL LOW (ref 3.5–5.1)
Sodium: 142 mmol/L (ref 135–145)

## 2020-12-20 LAB — CBC WITH DIFFERENTIAL/PLATELET
Abs Immature Granulocytes: 0.01 10*3/uL (ref 0.00–0.07)
Basophils Absolute: 0 10*3/uL (ref 0.0–0.1)
Basophils Relative: 0 %
Eosinophils Absolute: 0.2 10*3/uL (ref 0.0–0.5)
Eosinophils Relative: 4 %
HCT: 42.2 % (ref 36.0–46.0)
Hemoglobin: 14.2 g/dL (ref 12.0–15.0)
Immature Granulocytes: 0 %
Lymphocytes Relative: 49 %
Lymphs Abs: 1.8 10*3/uL (ref 0.7–4.0)
MCH: 28.1 pg (ref 26.0–34.0)
MCHC: 33.6 g/dL (ref 30.0–36.0)
MCV: 83.6 fL (ref 80.0–100.0)
Monocytes Absolute: 0.5 10*3/uL (ref 0.1–1.0)
Monocytes Relative: 14 %
Neutro Abs: 1.2 10*3/uL — ABNORMAL LOW (ref 1.7–7.7)
Neutrophils Relative %: 33 %
Platelets: 173 10*3/uL (ref 150–400)
RBC: 5.05 MIL/uL (ref 3.87–5.11)
RDW: 13.4 % (ref 11.5–15.5)
WBC: 3.6 10*3/uL — ABNORMAL LOW (ref 4.0–10.5)
nRBC: 0 % (ref 0.0–0.2)

## 2020-12-20 LAB — URINALYSIS, ROUTINE W REFLEX MICROSCOPIC
Bilirubin Urine: NEGATIVE
Glucose, UA: NEGATIVE mg/dL
Ketones, ur: NEGATIVE mg/dL
Nitrite: NEGATIVE
Protein, ur: NEGATIVE mg/dL
Specific Gravity, Urine: 1.01 (ref 1.005–1.030)
pH: 6 (ref 5.0–8.0)

## 2020-12-20 LAB — URINALYSIS, MICROSCOPIC (REFLEX)

## 2020-12-20 MED ORDER — CEPHALEXIN 250 MG PO CAPS
500.0000 mg | ORAL_CAPSULE | Freq: Once | ORAL | Status: AC
  Administered 2020-12-20: 500 mg via ORAL
  Filled 2020-12-20: qty 2

## 2020-12-20 MED ORDER — KETOROLAC TROMETHAMINE 30 MG/ML IJ SOLN
30.0000 mg | Freq: Once | INTRAMUSCULAR | Status: AC
  Administered 2020-12-20: 30 mg via INTRAVENOUS
  Filled 2020-12-20: qty 1

## 2020-12-20 MED ORDER — ONDANSETRON HCL 4 MG/2ML IJ SOLN
4.0000 mg | Freq: Once | INTRAMUSCULAR | Status: AC
  Administered 2020-12-20: 4 mg via INTRAVENOUS
  Filled 2020-12-20: qty 2

## 2020-12-20 MED ORDER — TAMSULOSIN HCL 0.4 MG PO CAPS: 0.4000 mg | ORAL_CAPSULE | Freq: Every day | ORAL | 0 refills | Status: DC

## 2020-12-20 MED ORDER — TAMSULOSIN HCL 0.4 MG PO CAPS
0.4000 mg | ORAL_CAPSULE | ORAL | Status: AC
  Administered 2020-12-20: 0.4 mg via ORAL
  Filled 2020-12-20: qty 1

## 2020-12-20 MED ORDER — DICLOFENAC SODIUM ER 100 MG PO TB24: 100.0000 mg | ORAL_TABLET | Freq: Every day | ORAL | 0 refills | Status: DC

## 2020-12-20 MED ORDER — TRAMADOL HCL 50 MG PO TABS
50.0000 mg | ORAL_TABLET | Freq: Once | ORAL | Status: AC
  Administered 2020-12-20: 50 mg via ORAL
  Filled 2020-12-20: qty 1

## 2020-12-20 MED ORDER — ONDANSETRON 8 MG PO TBDP: ORAL_TABLET | ORAL | 0 refills | Status: DC

## 2020-12-20 MED ORDER — TRAMADOL HCL 50 MG PO TABS: 50.0000 mg | ORAL_TABLET | Freq: Four times a day (QID) | ORAL | 0 refills | Status: DC | PRN

## 2020-12-20 MED ORDER — CEPHALEXIN 250 MG PO CAPS: 250.0000 mg | ORAL_CAPSULE | Freq: Four times a day (QID) | ORAL | 0 refills | Status: DC

## 2020-12-20 NOTE — ED Notes (Signed)
Patient transported to CT 

## 2020-12-20 NOTE — ED Provider Notes (Signed)
[facility] DEPART[facility] Provider Note   CSN: [id number] Arrival date & time: 12/20/20  [id number]     History Chief Complaint  Patient presents with  . Flank Pain    Yvonne Chapman is a 60 y.o. female.  The history is provided by the patient.  Flank Pain This is a recurrent problem. The current episode started 3 to 5 hours ago. The problem occurs constantly. The problem has not changed since onset.Pertinent negatives include no chest pain, no abdominal pain, no headaches and no shortness of breath. Nothing aggravates the symptoms. Nothing relieves the symptoms. She has tried nothing for the symptoms. The treatment provided no relief.  L flank pain with nausea after urinating this evening.  No hematuria, no dysuria.      Past Medical History:  Diagnosis Date  . Abnormal uterine bleeding   . Anemia   . Back pain   . Bacterial vaginosis   . Dysmenorrhea   . Endometriosis   . Fatty liver   . Fibroids   . GERD (gastroesophageal reflux disease)   . Gonorrhea   . Herpes   . History of chicken pox   . Hypertension   . Lactose intolerance   . 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 Monilia infection   . Ovarian cyst   . Sarcoidosis     Patient Active Problem List   Diagnosis Date Noted  . Sarcoidosis   . Vaginitis   . Dysmenorrhea   . Fibroids   . Bacterial vaginosis   . Yeast infection   . Trichomonas   . Herpes   . Monilia infection   . Anemia   . Ovarian cyst     Past Surgical History:  Procedure Laterality Date  . ABDOMINAL HYSTERECTOMY    . DILATION AND CURETTAGE OF UTERUS    . FOOT SURGERY    . LAPAROSCOPIC OVARIAN CYSTECTOMY    . 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     10 years ago  . WISDOM TOOTH EXTRACTION       OB History     Gravida  2   Para  0   Term      Preterm      AB      Living         SAB      IAB      Ectopic      Multiple      Live Births              Family History  Problem Relation Age of Onset  . Hypertension  Mother   . Hyperlipidemia Mother   . Hypertension Father   . Diabetes Father   . Hyperlipidemia Father   . Cancer Father     Social History   Tobacco Use  . Smoking status: Never  . Smokeless tobacco: Never  Substance Use Topics  . Alcohol use: No  . Drug use: No    Home Medications Prior to Admission medications   Medication Sig Start Date End Date Taking? Authorizing Provider  cephALEXin (KEFLEX) 250 MG capsule Take 1 capsule (250 mg total) by mouth 4 (four) times daily. 12/20/20  Yes Jasin Brazel, MD  Diclofenac Sodium CR 100 MG 24 hr tablet Take 1 tablet (100 mg total) by mouth daily. 12/20/20  Yes Legaci Tarman, MD  ondansetron (ZOFRAN ODT) 8 MG disintegrating tablet '8mg'$  ODT q8 hours prn nausea 12/20/20  Yes Jasnoor Trussell, MD  tamsulosin (FLOMAX) 0.4  MG CAPS capsule Take 1 capsule (0.4 mg total) by mouth daily. 12/20/20  Yes Sandy Blouch, MD  Cholecalciferol (VITAMIN D PO) Take by mouth.    [provider]  Fexofenadine HCl (ALLEGRA PO) Take by mouth.    [provider]  Multiple Vitamin (MULITIVITAMIN WITH MINERALS) TABS Take 1 tablet by mouth daily.    [provider]    Allergies    Elemental sulfur, Floxin [ofloxacin], and Metronidazole  Review of Systems   Review of Systems  Constitutional:  Negative for fever.  HENT:  Negative for congestion.   Eyes:  Negative for redness.  Respiratory:  Negative for shortness of breath.   Cardiovascular:  Negative for chest pain.  Gastrointestinal:  Positive for nausea. Negative for abdominal pain and vomiting.  Genitourinary:  Positive for flank pain. Negative for dysuria.  Musculoskeletal:  Negative for arthralgias.  Skin:  Negative for rash.  Neurological:  Negative for headaches.  Psychiatric/Behavioral:  Negative for agitation.   All other systems reviewed and are negative.  Physical Exam Updated Vital Signs BP (!) 143/62 (BP Location: Right Arm)   Pulse 85   Temp 99.6 F (37.6 C)  (Oral)   Resp 18   Ht '5\' 7"'$  (1.702 m)   Wt 100.2 kg   SpO2 100%   BMI 34.61 kg/m   Physical Exam Vitals and nursing note reviewed.  Constitutional:      General: She is not in acute distress.    Appearance: Normal appearance.  HENT:     Head: Normocephalic and atraumatic.     Nose: Nose normal.  Eyes:     Conjunctiva/sclera: Conjunctivae normal.     Pupils: Pupils are equal, round, and reactive to light.  Cardiovascular:     Rate and Rhythm: Normal rate and regular rhythm.     Pulses: Normal pulses.     Heart sounds: Normal heart sounds.  Pulmonary:     Effort: Pulmonary effort is normal.     Breath sounds: Normal breath sounds.  Abdominal:     General: Abdomen is flat. Bowel sounds are normal.     Palpations: Abdomen is soft.     Tenderness: There is no abdominal tenderness. There is no guarding or rebound.  Musculoskeletal:        General: Normal range of motion.     Cervical back: Normal range of motion and neck supple.  Skin:    General: Skin is warm and dry.     Capillary Refill: Capillary refill takes less than 2 seconds.  Neurological:     General: No focal deficit present.     Mental Status: She is alert and oriented to person, place, and time.     Deep Tendon Reflexes: Reflexes normal.  Psychiatric:        Mood and Affect: Mood normal.        Behavior: Behavior normal.    ED Results / Procedures / Treatments   Labs (all labs ordered are listed, but only abnormal results are displayed) Labs Reviewed  CBC WITH DIFFERENTIAL/PLATELET - Abnormal; Notable for the following components:      Result Value   WBC 3.6 (*)    Neutro Abs 1.2 (*)    All other components within normal limits  BASIC METABOLIC PANEL - Abnormal; Notable for the following components:   Potassium 3.2 (*)    Glucose, Bld 110 (*)    All other components within normal limits  URINALYSIS, ROUTINE W REFLEX MICROSCOPIC - Abnormal; Notable  for the following components:   Hgb urine dipstick  SMALL (*)    Leukocytes,Ua MODERATE (*)    All other components within normal limits  URINALYSIS, MICROSCOPIC (REFLEX) - Abnormal; Notable for the following components:   Bacteria, UA FEW (*)    Non Squamous Epithelial PRESENT (*)    All other components within normal limits    EKG None  Radiology CT Renal Stone Study  Result Date: 12/20/2020 CLINICAL DATA:  Left flank pain EXAM: CT ABDOMEN AND PELVIS WITHOUT CONTRAST TECHNIQUE: Multidetector CT imaging of the abdomen and pelvis was performed following the standard protocol without IV contrast. COMPARISON:  08/24/2018 FINDINGS: Lower chest: No acute abnormality. 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 The liver contour is nodular and the liver is extremely small in size in keeping with changes of cirrhosis. No definite focal intrahepatic mass identified on this noncontrast examination. Gallbladder unremarkable. No intra or extrahepatic biliary ductal dilation. Pancreas: Unremarkable Spleen: Unremarkable Adrenals/Urinary Tract: The adrenal glands are unremarkable. The kidneys are normal in size and position. Previously noted right UVJ calculus has resolved, presumably passed in the interval. A 1 mm nonobstructing calculus is seen within the left ureterovesicular junction. No additional nephro or urolithiasis. No hydronephrosis. The bladder is decompressed. Stomach/Bowel: The stomach, small bowel, and large bowel are unremarkable. Appendix normal. No free intraperitoneal gas or fluid. Vascular/Lymphatic: Rim calcified splenic artery aneurysm is unchanged by my measurement measuring 2.9 x 2.8 x 3.6 cm in greatest dimension on axial image # 20 and coronal image # 50. This has, however, enlarged since remote prior examination of 08/12/2008 where this measured 2.6 cm. Mild aortic atherosclerotic calcification. No aortic aneurysm. No pathologic adenopathy within the abdomen and pelvis. Reproductive: Status post hysterectomy. No adnexal  masses. Other: No abdominal wall hernia.  Rectum unremarkable. Musculoskeletal: No acute bone abnormality. No lytic or blastic bone lesion. IMPRESSION: 1 mm nonobstructing calculus within the left ureterovesicular junction. Changes in keeping with advanced cirrhosis. Portosystemic shunt again noted, unchanged but not optimally evaluated on this noncontrast examination. 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 Given its size, interventional radiology consultation may be helpful for further management. Electronically Signed   By: Fidela Salisbury M.D.   On: 12/20/2020 03:47    Procedures Procedures   Medications Ordered in ED Medications  tamsulosin (FLOMAX) capsule 0.4 mg (has no administration in time range)  cephALEXin (KEFLEX) capsule 500 mg (has no administration in time range)  traMADol (ULTRAM) tablet 50 mg (has no administration in time range)  ketorolac (TORADOL) 30 MG/ML injection 30 mg (30 mg Intravenous Given 12/20/20 0300)  ondansetron (ZOFRAN) injection 4 mg (4 mg Intravenous Given 12/20/20 0300)    ED Course  I have reviewed the triage vital signs and the nursing notes.  Pertinent labs & imaging results that were available during my care of the patient were reviewed by me and considered in my medical decision making (see chart for details).   Patient informed of splenic artery aneurysm and need for close follow up with vascular surgery, referral made.  Patient also referred to urology for ongoing care of kidney stones.  Medication ordered and note provided for work.     ISREAL RUNQUIST was evaluated in Emergency Department on 12/20/2020 for the symptoms described in the history of present illness. She was evaluated in the context of the global COVID-19 pandemic, which necessitated consideration that the patient might be at risk for infection with the SARS-CoV-2 virus that causes COVID-19. Institutional protocols and algorithms  that pertain to the evaluation of patients at risk for COVID-19  are in a state of rapid change based on information released by regulatory bodies including the CDC and federal and state organizations. These policies and algorithms were followed during the patient's care in the ED.  Final Clinical Impression(s) / ED Diagnoses Final diagnoses:  Ureteral colic  Splenic artery aneurysm (HCC)  Cirrhosis of liver without ascites, unspecified hepatic cirrhosis type ([place])  Urinary tract infection without hematuria, site unspecified   Return for intractable cough, coughing up blood, fevers > 100.4 unrelieved by medication, shortness of breath, intractable vomiting, chest pain, shortness of breath, weakness, numbness, changes in speech, facial asymmetry, abdominal pain, passing out, Inability to tolerate liquids or food, cough, altered mental status or any concerns. No signs of systemic illness or infection. The patient is nontoxic-appearing on exam and vital signs are within normal limits. I have reviewed the triage vital signs and the nursing notes. Pertinent labs & imaging results that were available during my care of the patient were reviewed by me and considered in my medical decision making (see chart for details). After history, exam, and medical workup I feel the patient has been appropriately medically screened and is safe for discharge home. Pertinent diagnoses were discussed with the patient. Patient was given return precautions. Rx / DC Orders ED Discharge Orders          Ordered    cephALEXin (KEFLEX) 250 MG capsule  4 times daily        12/20/20 0400    tamsulosin (FLOMAX) 0.4 MG CAPS capsule  Daily        12/20/20 0400    Diclofenac Sodium CR 100 MG 24 hr tablet  Daily        12/20/20 0400    ondansetron (ZOFRAN ODT) 8 MG disintegrating tablet        12/20/20 0400             Harjit Douds, MD 12/20/20 0410

## 2020-12-20 NOTE — ED Triage Notes (Signed)
Reports left flank pain that started 4 hours pta.  Hx of kidney stones.  Denies any other urinary symptoms.  Does endorse some nausea as well.

## 2020-12-20 NOTE — ED Notes (Signed)
Pt A&Ox4 ambulatory at d/c with independent steady gait. Pt verbalized understanding of d/c instructions, prescriptions and follow up care. 

## 2020-12-20 NOTE — ED Notes (Signed)
Pt ambulatory to and from restroom with independent steady gait. 

## 2020-12-22 ENCOUNTER — Other Ambulatory Visit: Payer: Self-pay

## 2020-12-22 ENCOUNTER — Emergency Department (HOSPITAL_BASED_OUTPATIENT_CLINIC_OR_DEPARTMENT_OTHER)
Admission: EM | Admit: 2020-12-22 | Discharge: 2020-12-22 | Disposition: A | Discharge status: Home / Self Care | Payer: Federal, State, Local not specified - PPO | Attending: Emergency Medicine | Admitting: Emergency Medicine

## 2020-12-22 ENCOUNTER — Emergency Department (HOSPITAL_BASED_OUTPATIENT_CLINIC_OR_DEPARTMENT_OTHER): Payer: Federal, State, Local not specified - PPO

## 2020-12-22 ENCOUNTER — Encounter (HOSPITAL_BASED_OUTPATIENT_CLINIC_OR_DEPARTMENT_OTHER): Payer: Self-pay | Admitting: *Deleted

## 2020-12-22 DIAGNOSIS — I728 Aneurysm of other specified arteries: Secondary | ICD-10-CM | POA: Diagnosis not present

## 2020-12-22 DIAGNOSIS — R109 Unspecified abdominal pain: Secondary | ICD-10-CM | POA: Diagnosis not present

## 2020-12-22 DIAGNOSIS — K219 Gastro-esophageal reflux disease without esophagitis: Secondary | ICD-10-CM | POA: Insufficient documentation

## 2020-12-22 DIAGNOSIS — N202 Calculus of kidney with calculus of ureter: Secondary | ICD-10-CM | POA: Insufficient documentation

## 2020-12-22 DIAGNOSIS — N201 Calculus of ureter: Secondary | ICD-10-CM

## 2020-12-22 DIAGNOSIS — I714 Abdominal aortic aneurysm, without rupture: Secondary | ICD-10-CM | POA: Diagnosis not present

## 2020-12-22 LAB — CBC WITH DIFFERENTIAL/PLATELET
Abs Immature Granulocytes: 0.01 10*3/uL (ref 0.00–0.07)
Basophils Absolute: 0 10*3/uL (ref 0.0–0.1)
Basophils Relative: 1 %
Eosinophils Absolute: 0.1 10*3/uL (ref 0.0–0.5)
Eosinophils Relative: 3 %
HCT: 38.7 % (ref 36.0–46.0)
Hemoglobin: 12.9 g/dL (ref 12.0–15.0)
Immature Granulocytes: 0 %
Lymphocytes Relative: 23 %
Lymphs Abs: 0.9 10*3/uL (ref 0.7–4.0)
MCH: 28 pg (ref 26.0–34.0)
MCHC: 33.3 g/dL (ref 30.0–36.0)
MCV: 84.1 fL (ref 80.0–100.0)
Monocytes Absolute: 0.5 10*3/uL (ref 0.1–1.0)
Monocytes Relative: 11 %
Neutro Abs: 2.5 10*3/uL (ref 1.7–7.7)
Neutrophils Relative %: 62 %
Platelets: 144 10*3/uL — ABNORMAL LOW (ref 150–400)
RBC: 4.6 MIL/uL (ref 3.87–5.11)
RDW: 13.4 % (ref 11.5–15.5)
WBC: 4 10*3/uL (ref 4.0–10.5)
nRBC: 0 % (ref 0.0–0.2)

## 2020-12-22 LAB — URINALYSIS, ROUTINE W REFLEX MICROSCOPIC
Bilirubin Urine: NEGATIVE
Glucose, UA: NEGATIVE mg/dL
Ketones, ur: NEGATIVE mg/dL
Nitrite: NEGATIVE
Protein, ur: NEGATIVE mg/dL
Specific Gravity, Urine: 1.02 (ref 1.005–1.030)
pH: 6 (ref 5.0–8.0)

## 2020-12-22 LAB — COMPREHENSIVE METABOLIC PANEL
ALT: 18 U/L (ref 0–44)
AST: 37 U/L (ref 15–41)
Albumin: 3.2 g/dL — ABNORMAL LOW (ref 3.5–5.0)
Alkaline Phosphatase: 129 U/L — ABNORMAL HIGH (ref 38–126)
Anion gap: 8 (ref 5–15)
BUN: 13 mg/dL (ref 6–20)
CO2: 26 mmol/L (ref 22–32)
Calcium: 8.4 mg/dL — ABNORMAL LOW (ref 8.9–10.3)
Chloride: 105 mmol/L (ref 98–111)
Creatinine, Ser: 1.25 mg/dL — ABNORMAL HIGH (ref 0.44–1.00)
GFR, Estimated: 50 mL/min — ABNORMAL LOW (ref >60)
Glucose, Bld: 89 mg/dL (ref 70–99)
Potassium: 3.7 mmol/L (ref 3.5–5.1)
Sodium: 139 mmol/L (ref 135–145)
Total Bilirubin: 0.9 mg/dL (ref 0.3–1.2)
Total Protein: 6.4 g/dL — ABNORMAL LOW (ref 6.5–8.1)

## 2020-12-22 LAB — URINALYSIS, MICROSCOPIC (REFLEX)

## 2020-12-22 MED ORDER — IOHEXOL 350 MG/ML SOLN
100.0000 mL | Freq: Once | INTRAVENOUS | Status: AC | PRN
  Administered 2020-12-22: 100 mL via INTRAVENOUS

## 2020-12-22 MED ORDER — IBUPROFEN 400 MG PO TABS
400.0000 mg | ORAL_TABLET | Freq: Once | ORAL | Status: DC
  Filled 2020-12-22: qty 1

## 2020-12-22 MED ORDER — SODIUM CHLORIDE 0.9 % IV BOLUS
500.0000 mL | Freq: Once | INTRAVENOUS | Status: AC
  Administered 2020-12-22: 500 mL via INTRAVENOUS

## 2020-12-22 NOTE — ED Provider Notes (Signed)
[facility] EMERGENCY DEPARTMENT Provider Note   CSN: [id number] Arrival date & time: 12/22/20  0741     History Chief Complaint  Patient presents with   Back Pain    Yvonne Chapman is a 60 y.o. female.  Presenting to ER with concern for vomiting\flank pain.  Patient has been having pain for the last few days.  Came to the emergency department on 8/22 and was diagnosed with a 1 mm left ureteral stone.  Patient states that she attempted to follow-up with urology and has an appointment in September.  Pain is intermittent, left-sided, nonradiating, sharp.  Moderate.  Currently 4 out of 10 in severity.  Feels like she is urinating more frequently than normal.  No pain with urination.  Felt feverish yesterday.  No documented fever.  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    HPI     Past Medical History:  Diagnosis Date   Abnormal uterine bleeding    Anemia    Back pain    Bacterial vaginosis    Dysmenorrhea    Endometriosis    Fatty liver    Fibroids    GERD (gastroesophageal reflux disease)    Gonorrhea    Herpes    History of chicken pox    Hypertension    Lactose intolerance    Liver disorder    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   Monilia infection    Ovarian cyst    Sarcoidosis     Patient Active Problem List   Diagnosis Date Noted   Sarcoidosis    Vaginitis    Dysmenorrhea    Fibroids    Bacterial vaginosis    Yeast infection    Trichomonas    Herpes    Monilia infection    Anemia    Ovarian cyst     Past Surgical History:  Procedure Laterality Date   ABDOMINAL HYSTERECTOMY     DILATION AND CURETTAGE OF UTERUS     FOOT SURGERY     LAPAROSCOPIC OVARIAN CYSTECTOMY     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     10 years ago   WISDOM TOOTH EXTRACTION       OB History     Gravida  2   Para  0   Term      Preterm      AB      Living         SAB      IAB      Ectopic      Multiple       Live Births              Family History  Problem Relation Age of Onset   Hypertension Mother    Hyperlipidemia Mother    Hypertension Father    Diabetes Father    Hyperlipidemia Father    Cancer Father     Social History   Tobacco Use   Smoking status: Never   Smokeless tobacco: Never  Substance Use Topics   Alcohol use: No   Drug use: No    Home Medications Prior to Admission medications   Medication Sig Start Date End Date Taking? Authorizing Provider  cephALEXin (KEFLEX) 250 MG capsule Take 1 capsule (250 mg total) by mouth 4 (four) times daily. 12/20/20   Palumbo, April, MD  Cholecalciferol (VITAMIN D PO) Take by mouth.    [provider]  Diclofenac Sodium CR 100 MG  24 hr tablet Take 1 tablet (100 mg total) by mouth daily. 12/20/20   Palumbo, April, MD  Fexofenadine HCl (ALLEGRA PO) Take by mouth.    [provider]  Multiple Vitamin (MULITIVITAMIN WITH MINERALS) TABS Take 1 tablet by mouth daily.    [provider]  ondansetron (ZOFRAN ODT) 8 MG disintegrating tablet '8mg'$  ODT q8 hours prn nausea 12/20/20   Palumbo, April, MD  tamsulosin (FLOMAX) 0.4 MG CAPS capsule Take 1 capsule (0.4 mg total) by mouth daily. 12/20/20   Palumbo, April, MD  traMADol (ULTRAM) 50 MG tablet Take 1 tablet (50 mg total) by mouth every 6 (six) hours as needed for severe pain (cannot drive or drink alcohol with this medication). 12/20/20   Palumbo, April, MD    Allergies    Elemental sulfur, Floxin [ofloxacin], and Metronidazole  Review of Systems   Review of Systems  Constitutional:  Negative for chills and fever.  HENT:  Negative for ear pain and sore throat.   Eyes:  Negative for pain and visual disturbance.  Respiratory:  Negative for cough and shortness of breath.   Cardiovascular:  Negative for chest pain and palpitations.  Gastrointestinal:  Negative for abdominal pain and vomiting.  Genitourinary:  Positive for flank pain. Negative for dysuria, enuresis  and hematuria.  Musculoskeletal:  Positive for back pain. Negative for arthralgias.  Skin:  Negative for color change and rash.  Neurological:  Negative for seizures and syncope.  All other systems reviewed and are negative.  Physical Exam Updated Vital Signs BP (!) 145/58   Pulse 69   Temp 97.8 F (36.6 C) (Oral)   Resp 18   Ht '5\' 7"'$  (1.702 m)   Wt 100.2 kg   SpO2 100%   BMI 34.60 kg/m   Physical Exam Vitals and nursing note reviewed.  Constitutional:      General: She is not in acute distress.    Appearance: She is well-developed.  HENT:     Head: Normocephalic and atraumatic.  Eyes:     Conjunctiva/sclera: Conjunctivae normal.  Cardiovascular:     Rate and Rhythm: Normal rate and regular rhythm.     Heart sounds: No murmur heard. Pulmonary:     Effort: Pulmonary effort is normal. No respiratory distress.     Breath sounds: Normal breath sounds.  Abdominal:     Palpations: Abdomen is soft.     Tenderness: There is no abdominal tenderness.  Musculoskeletal:        General: No deformity or signs of injury.     Cervical back: Neck supple.  Skin:    General: Skin is warm and dry.  Neurological:     General: No focal deficit present.     Mental Status: She is alert.  Psychiatric:        Mood and Affect: Mood normal.    ED Results / Procedures / Treatments   Labs (all labs ordered are listed, but only abnormal results are displayed) Labs Reviewed  CBC WITH DIFFERENTIAL/PLATELET - Abnormal; Notable for the following components:      Result Value   Platelets 144 (*)    All other components within normal limits  COMPREHENSIVE METABOLIC PANEL - Abnormal; Notable for the following components:   Creatinine, Ser 1.25 (*)    Calcium 8.4 (*)    Total Protein 6.4 (*)    Albumin 3.2 (*)    Alkaline Phosphatase 129 (*)    GFR, Estimated 50 (*)    All other components  within normal limits  URINALYSIS, ROUTINE W REFLEX MICROSCOPIC - Abnormal; Notable for the following  components:   APPearance HAZY (*)    Hgb urine dipstick SMALL (*)    Leukocytes,Ua TRACE (*)    All other components within normal limits  URINALYSIS, MICROSCOPIC (REFLEX) - Abnormal; Notable for the following components:   Bacteria, UA MANY (*)    All other components within normal limits    EKG None  Radiology CT Angio Abd/Pel W and/or Wo Contrast  Addendum Date: 12/22/2020   ADDENDUM REPORT: 12/22/2020 09:53 IMPRESSION: Nonvascular 1. Again seen is a punctate 1 mm calculi at the left ureterovesical junction, unchanged. Electronically Signed   By: Albin Felling M.D.   On: 12/22/2020 09:53   Result Date: 12/22/2020 CLINICAL DATA:  Splenic artery dissection suspected left flank pain, concern for splenic artery aneurysm rupture or enlargening of the aneurysm EXAM: CTA ABDOMEN AND PELVIS WITHOUT AND WITH CONTRAST TECHNIQUE: Multidetector CT imaging of the abdomen and pelvis was performed using the standard protocol during bolus administration of intravenous contrast. Multiplanar reconstructed images and MIPs were obtained and reviewed to evaluate the vascular anatomy. CONTRAST:  172m OMNIPAQUE IOHEXOL 350 MG/ML SOLN COMPARISON:  December 20, 2020 FINDINGS: VASCULAR Aorta: Normal caliber aorta without aneurysm, dissection, vasculitis or significant stenosis. Celiac: Celiac trunk is patent. Again seen is a peripherally calcified splenic artery aneurysms measuring up to 3.4 cm. SMA: Patent without evidence of aneurysm, dissection, vasculitis or significant stenosis. IMA: Patent. Renals: Both renal arteries are patent without evidence of aneurysm, dissection, vasculitis, fibromuscular dysplasia or significant stenosis. Inflow: Patent without evidence of aneurysm, dissection, vasculitis or significant stenosis. Veins: TIPS present, patency not evaluated on this exam. NON-VASCULAR Inferior chest: 1 Hepatobiliary: The liver is normal in size without focal abnormality. No intrahepatic or extrahepatic biliary  ductal dilation. The gallbladder appears normal. Spleen: Normal in size without focal abnormality. Pancreas: No pancreatic ductal dilatation or surrounding inflammatory changes. Adrenals/Urinary Tract: Adrenal glands are unremarkable. Kidneys are normal, without renal calculi, focal lesion, or hydronephrosis. Bladder is unremarkable. Stomach/Bowel: The stomach, small bowel and large bowel are normal in caliber without abnormal wall thickening or surrounding inflammatory changes. Reproductive: No suspicious adnexal mass. Lymphatic: No enlarged lymph nodes in the abdomen or pelvis. Other: No abdominopelvic ascites. Musculoskeletal: No aggressive osseous lesions. The soft tissues are unremarkable. IMPRESSION: VASCULAR 1. Again seen is a peripherally calcified splenic artery aneurysm measuring up to 3.4 cm, stable in size. Consultation with interventional radiology is recommended due to increased risk of rupture over time with visceral artery aneurysms of this size. 2. The patient is status post TIPS creation. NON-VASCULAR 1. No acute findings in the abdomen or pelvis. Electronically Signed: By: YAlbin FellingM.D. On: 12/22/2020 09:37    Procedures Procedures   Medications Ordered in ED Medications  sodium chloride 0.9 % bolus 500 mL (0 mLs Intravenous Stopped 12/22/20 0926)  iohexol (OMNIPAQUE) 350 MG/ML injection 100 mL (100 mLs Intravenous Contrast Given 12/22/20 [id number]    ED Course  I have reviewed the triage vital signs and the nursing notes.  Pertinent labs & imaging results that were available during my care of the patient were reviewed by me and considered in my medical decision making (see chart for details).    MDM Rules/Calculators/A&P                            60year old lady presents to ER with concern for low back/left  flank pain.  Recent visit for similar symptoms.  Diagnosed with left ureteral stone at [facility].  Patient also had incidental finding of splenic artery or aneurysm.  Patient  appears well and has stable vital signs.  Given the location of pain, checked CTA to rule out complication from her aneurysm.  Aneurysm is stable.  Reviewed with the radiologist, he recommended referring patient to interventional radiology on an outpatient basis.  I discussed this with patient and instructed her to contact their office.  Patient demonstrated good understanding.  Still has the 1 mm stone.  Suspect this is the culprit for her symptoms.  No fever.  We noted some bacteria but squamous epithelial cells noted and no WBCs and only trace leukocytes and negative nitrates.  Already prescribed Keflex.  Basic labs noted for mild elevation in creatinine.  Recommend she continue the Keflex, follow-up with urology and primary care.  Reviewed return precautions and discharged    After the discussed management above, the patient was determined to be safe for discharge.  The patient was in agreement with this plan and all questions regarding their care were answered.  ED return precautions were discussed and the patient will return to the ED with any significant worsening of condition.   Final Clinical Impression(s) / ED Diagnoses Final diagnoses:  Ureteral stone  Splenic artery aneurysm [facility])    Rx / DC Orders ED Discharge Orders          Ordered    Ambulatory referral to Interventional Radiology        12/22/20 0953             Lucrezia Starch, MD 12/22/20 1044

## 2020-12-22 NOTE — ED Notes (Signed)
ED Provider at bedside. 

## 2020-12-22 NOTE — Discharge Instructions (Addendum)
[id number] - call to set up an appointment with Interventional Radiology; They are located at [place].  Follow up with urology and your primary care doctor. Your kidney function was slightly worse today. You should have it rechecked by your primary doctor.  For pain, take the previously prescribed tramadol as needed.  Additionally can take Tylenol.  Would recommend avoiding anti-inflammatory such as Motrin or naproxen.  If you develop worsening pain, vomiting, fever, come back to ER for reassessment.

## 2020-12-22 NOTE — ED Triage Notes (Signed)
Continued back pain , with nausea. Having difficulty beginning her urine stream, is having frequency at times as well

## 2020-12-22 NOTE — ED Notes (Signed)
Pt discharged to home. Discharge instructions have been discussed with patient and/or family members. Pt verbally acknowledges understanding d/c instructions, and endorses comprehension to checkout at registration before leaving.  °

## 2020-12-23 ENCOUNTER — Other Ambulatory Visit: Payer: Self-pay | Admitting: Emergency Medicine

## 2020-12-23 DIAGNOSIS — I728 Aneurysm of other specified arteries: Secondary | ICD-10-CM

## 2020-12-29 ENCOUNTER — Telehealth: Payer: Self-pay

## 2020-12-29 DIAGNOSIS — R399 Unspecified symptoms and signs involving the genitourinary system: Secondary | ICD-10-CM | POA: Diagnosis not present

## 2020-12-29 DIAGNOSIS — N390 Urinary tract infection, site not specified: Secondary | ICD-10-CM | POA: Diagnosis not present

## 2020-12-29 DIAGNOSIS — N200 Calculus of kidney: Secondary | ICD-10-CM | POA: Diagnosis not present

## 2020-12-29 DIAGNOSIS — K5900 Constipation, unspecified: Secondary | ICD-10-CM | POA: Diagnosis not present

## 2020-12-29 NOTE — Telephone Encounter (Signed)
S/W pt cancelled Consult appt stated that her care was being handled @ [facility]

## 2020-12-30 ENCOUNTER — Other Ambulatory Visit: Payer: Federal, State, Local not specified - PPO

## 2021-01-06 ENCOUNTER — Ambulatory Visit: Payer: Federal, State, Local not specified - PPO | Admitting: Dietician

## 2021-01-07 ENCOUNTER — Other Ambulatory Visit: Payer: Self-pay

## 2021-01-07 ENCOUNTER — Encounter: Payer: Self-pay | Admitting: Dietician

## 2021-01-07 ENCOUNTER — Encounter: Payer: Federal, State, Local not specified - PPO | Attending: Family Medicine | Admitting: Dietician

## 2021-01-07 DIAGNOSIS — K7469 Other cirrhosis of liver: Secondary | ICD-10-CM | POA: Diagnosis not present

## 2021-01-07 NOTE — Progress Notes (Signed)
Medical Nutrition Therapy  Appointment Start time:  [id number]  Appointment End time:  [id number] Patient is here today alone Primary concerns today:  Patient states that she does not like the best.  "I like sugar and know it is not the best for my liver."  She states that she needs to lose weight.   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  Thinks that she has lost inches.   Clinical Medical Hx: sarcoidosis effecting liver, hepatic stenosis Medications: see chart Labs: Cholesterol 217, Triglycerides 81, HDL 59, LDL 144 08/12/20 and BUN 13, Creatinine 1.25 with GFR 50 12/22/2020 Notable Signs/Symptoms: none  Lifestyle & Dietary Hx Patient's 60 yo mother lives with her and has dementia.  She is in the early stages.  Satia does the shopping and cooking.  Friend cooks for them at times (vegetarian). She works for [employer].  This is high stress.  She is currently working from home. She follows a low sodium diet when at home.   Sometimes will skip meals as she is not hungry.  Supplements: MVI, vitamin D, probiotic Sleep: light sleeper as she is always listening for her mother - 8 hours Stress / self-care: job and caring for mother Current average weekly physical activity: none as she fractured her ankle in April 2022 and it still swells.  Her goal is to start in mid October.  24-Hr Dietary Recall Stopped sweet tea as she was drinking a gallon per day. Does not tolerate broccoli Lactose intolerant  Mom does not like chicken and there are other food differences that make cooking challenging. First Meal: muffin from [facility] with Peanut butter, strawberry lemonade original oatmeal (unsweetened instant), raisins, prunes, benefiber) OR plain cheerios, Oat milk Snack: occasional nuts and cheese  ([place] snacks) or raisins Second Meal: 1/2 ruben sandwich and pickle, potato salad, birthday cake, strawberry lemonade Snack: occasional Third Meal: fish, salad OR [place] and vegetables, red potatoes  Snack: none Beverages: water, lemonade   NUTRITION DIAGNOSIS  NB-1.1 Food and nutrition-related knowledge deficit As related to nutrition and liver disease.  As evidenced by diet hx and patient report.   NUTRITION INTERVENTION  Nutrition education (E-1) on the following topics:  Alternative sweeteners to sugar (stevia) and product name Beverage choices - avoid high fructose corn syrup Book resource as she enjoys reading Low sodium guidelines, label reading, eating out, tips Benefits of increased vegetables, choice of whole grains, decreased sugar, lean proteins or plant based meals, legumes Mindful eating  Handouts Provided Include  TLC heart healthy handout from AND TLC tips to increase fiber from AND Mediterranean diet Meal plan card (2 carb choices per meal)  Learning Style & Readiness for Change Teaching method utilized: Visual & Auditory  Demonstrated degree of understanding via: Teach Back  Barriers to learning/adherence to lifestyle change: stress  Goals Established by Pt Begin walking most days around your cul-de-sac Continue to work on beverages to make choices that do not contain sugar True lemon packets to add to water to add to flavor.  It is sweetened with Stevia.  Continue to drink mostly water.  Great job on making the changes that you have! High fructose corn syrup is the sweetener found in regular soda and many other foods.  This is harmful for your liver.  Skinny Liver A Proven Program to Prevent and Reverse the  New Silent Epidemic-Fatty Liver Disease by Johnathan Hausen, MS, RD, LD and Hermelinda Medicus, MD  Limit sodium to 2000-2400 mg daily  ([phone] mg per meal). Rinse canned vegetables and beans or buy no added salt  Increase vegetables Fresh  Fruit Whole grains (brown rice, quinoa, bulgur, barley, etc.) Beans (start with small amounts and increase as tolerate) Lean protein or have a vegetarian meal Decrease sugar  Mindful eating.  Eat slowly and stop when you are satisfied.    Start exercising when you are able.  Start slow and increase as tolerated.  Calorie King app to help make mindful decisions when out   MONITORING & EVALUATION Dietary intake, weekly physical activity, and label reading prn.  Next Steps  Patient is to call for questions.

## 2021-01-07 NOTE — Patient Instructions (Addendum)
True lemon packets to add to water to add to flavor.  It is sweetened with Stevia.  Continue to drink mostly water.  Great job on making the changes that you have! High fructose corn syrup is the sweetener found in regular soda and many other foods.  This is harmful for your liver.  Skinny Liver A Proven Program to Prevent and Reverse the New Silent Epidemic-Fatty Liver Disease by Johnathan Hausen, MS, RD, LD and Hermelinda Medicus, MD  Limit sodium to 2000-2400 mg daily  ([phone] mg per meal). Rinse canned vegetables and beans or buy no added salt  Increase vegetables Fresh Fruit Whole grains (brown rice, quinoa, bulgur, barley, etc.) Beans (start with small amounts and increase as tolerate) Lean protein or have a vegetarian meal Decrease sugar  Mindful eating.  Eat slowly and stop when you are satisfied.    Start exercising when you are able.  Start slow and increase as tolerated.  Calorie King app to help make mindful decisions when out

## 2021-01-14 DIAGNOSIS — B954 Other streptococcus as the cause of diseases classified elsewhere: Secondary | ICD-10-CM | POA: Diagnosis not present

## 2021-01-14 DIAGNOSIS — N201 Calculus of ureter: Secondary | ICD-10-CM | POA: Diagnosis not present

## 2021-01-14 DIAGNOSIS — N390 Urinary tract infection, site not specified: Secondary | ICD-10-CM | POA: Diagnosis not present

## 2021-02-17 DIAGNOSIS — D869 Sarcoidosis, unspecified: Secondary | ICD-10-CM | POA: Diagnosis not present

## 2021-02-17 DIAGNOSIS — R600 Localized edema: Secondary | ICD-10-CM | POA: Diagnosis not present

## 2021-02-17 DIAGNOSIS — F419 Anxiety disorder, unspecified: Secondary | ICD-10-CM | POA: Diagnosis not present

## 2021-02-17 DIAGNOSIS — E7800 Pure hypercholesterolemia, unspecified: Secondary | ICD-10-CM | POA: Diagnosis not present

## 2021-02-18 DIAGNOSIS — K648 Other hemorrhoids: Secondary | ICD-10-CM | POA: Diagnosis not present

## 2021-02-18 DIAGNOSIS — Z1211 Encounter for screening for malignant neoplasm of colon: Secondary | ICD-10-CM | POA: Diagnosis not present

## 2021-04-28 DIAGNOSIS — R040 Epistaxis: Secondary | ICD-10-CM | POA: Diagnosis not present

## 2021-04-28 DIAGNOSIS — J069 Acute upper respiratory infection, unspecified: Secondary | ICD-10-CM | POA: Diagnosis not present

## 2021-04-28 DIAGNOSIS — Z03818 Encounter for observation for suspected exposure to other biological agents ruled out: Secondary | ICD-10-CM | POA: Diagnosis not present

## 2021-04-28 DIAGNOSIS — R059 Cough, unspecified: Secondary | ICD-10-CM | POA: Diagnosis not present

## 2021-05-17 DIAGNOSIS — Z01419 Encounter for gynecological examination (general) (routine) without abnormal findings: Secondary | ICD-10-CM | POA: Diagnosis not present

## 2021-05-17 DIAGNOSIS — Z1211 Encounter for screening for malignant neoplasm of colon: Secondary | ICD-10-CM | POA: Diagnosis not present

## 2021-05-17 DIAGNOSIS — Z1231 Encounter for screening mammogram for malignant neoplasm of breast: Secondary | ICD-10-CM | POA: Diagnosis not present

## 2021-05-17 DIAGNOSIS — R011 Cardiac murmur, unspecified: Secondary | ICD-10-CM | POA: Diagnosis not present

## 2021-05-25 DIAGNOSIS — I728 Aneurysm of other specified arteries: Secondary | ICD-10-CM | POA: Diagnosis not present

## 2021-05-25 DIAGNOSIS — R188 Other ascites: Secondary | ICD-10-CM | POA: Diagnosis not present

## 2021-05-25 DIAGNOSIS — Z6836 Body mass index (BMI) 36.0-36.9, adult: Secondary | ICD-10-CM | POA: Diagnosis not present

## 2021-05-25 DIAGNOSIS — Z79899 Other long term (current) drug therapy: Secondary | ICD-10-CM | POA: Diagnosis not present

## 2021-05-25 DIAGNOSIS — K7469 Other cirrhosis of liver: Secondary | ICD-10-CM | POA: Diagnosis not present

## 2021-05-25 DIAGNOSIS — Z95828 Presence of other vascular implants and grafts: Secondary | ICD-10-CM | POA: Diagnosis not present

## 2021-05-25 DIAGNOSIS — E669 Obesity, unspecified: Secondary | ICD-10-CM | POA: Diagnosis not present

## 2021-05-25 DIAGNOSIS — D8689 Sarcoidosis of other sites: Secondary | ICD-10-CM | POA: Diagnosis not present

## 2021-05-25 DIAGNOSIS — K7460 Unspecified cirrhosis of liver: Secondary | ICD-10-CM | POA: Diagnosis not present

## 2021-05-25 DIAGNOSIS — K766 Portal hypertension: Secondary | ICD-10-CM | POA: Diagnosis not present

## 2021-05-30 ENCOUNTER — Encounter: Payer: Self-pay | Admitting: Podiatry

## 2021-05-30 ENCOUNTER — Ambulatory Visit: Payer: Federal, State, Local not specified - PPO | Admitting: Podiatry

## 2021-05-30 ENCOUNTER — Other Ambulatory Visit: Payer: Self-pay

## 2021-05-30 DIAGNOSIS — D72819 Decreased white blood cell count, unspecified: Secondary | ICD-10-CM | POA: Insufficient documentation

## 2021-05-30 DIAGNOSIS — K5900 Constipation, unspecified: Secondary | ICD-10-CM | POA: Insufficient documentation

## 2021-05-30 DIAGNOSIS — J0100 Acute maxillary sinusitis, unspecified: Secondary | ICD-10-CM | POA: Insufficient documentation

## 2021-05-30 DIAGNOSIS — N200 Calculus of kidney: Secondary | ICD-10-CM | POA: Insufficient documentation

## 2021-05-30 DIAGNOSIS — G609 Hereditary and idiopathic neuropathy, unspecified: Secondary | ICD-10-CM | POA: Insufficient documentation

## 2021-05-30 DIAGNOSIS — M7580 Other shoulder lesions, unspecified shoulder: Secondary | ICD-10-CM | POA: Insufficient documentation

## 2021-05-30 DIAGNOSIS — G4700 Insomnia, unspecified: Secondary | ICD-10-CM | POA: Insufficient documentation

## 2021-05-30 DIAGNOSIS — R519 Headache, unspecified: Secondary | ICD-10-CM | POA: Insufficient documentation

## 2021-05-30 DIAGNOSIS — E559 Vitamin D deficiency, unspecified: Secondary | ICD-10-CM

## 2021-05-30 DIAGNOSIS — R399 Unspecified symptoms and signs involving the genitourinary system: Secondary | ICD-10-CM | POA: Insufficient documentation

## 2021-05-30 DIAGNOSIS — F419 Anxiety disorder, unspecified: Secondary | ICD-10-CM | POA: Insufficient documentation

## 2021-05-30 DIAGNOSIS — R600 Localized edema: Secondary | ICD-10-CM

## 2021-05-30 DIAGNOSIS — R011 Cardiac murmur, unspecified: Secondary | ICD-10-CM | POA: Insufficient documentation

## 2021-05-30 DIAGNOSIS — K739 Chronic hepatitis, unspecified: Secondary | ICD-10-CM | POA: Insufficient documentation

## 2021-05-30 DIAGNOSIS — I728 Aneurysm of other specified arteries: Secondary | ICD-10-CM

## 2021-05-30 DIAGNOSIS — L600 Ingrowing nail: Secondary | ICD-10-CM

## 2021-05-30 DIAGNOSIS — E7800 Pure hypercholesterolemia, unspecified: Secondary | ICD-10-CM | POA: Insufficient documentation

## 2021-05-30 DIAGNOSIS — E785 Hyperlipidemia, unspecified: Secondary | ICD-10-CM | POA: Insufficient documentation

## 2021-05-30 HISTORY — DX: Aneurysm of other specified arteries: I72.8

## 2021-05-30 HISTORY — DX: Unspecified symptoms and signs involving the genitourinary system: R39.9

## 2021-05-30 HISTORY — DX: Chronic hepatitis, unspecified: K73.9

## 2021-05-30 HISTORY — DX: Decreased white blood cell count, unspecified: D72.819

## 2021-05-30 HISTORY — DX: Calculus of kidney: N20.0

## 2021-05-30 HISTORY — DX: Pure hypercholesterolemia, unspecified: E78.00

## 2021-05-30 HISTORY — DX: Hyperlipidemia, unspecified: E78.5

## 2021-05-30 HISTORY — DX: Hereditary and idiopathic neuropathy, unspecified: G60.9

## 2021-05-30 HISTORY — DX: Other shoulder lesions, unspecified shoulder: M75.80

## 2021-05-30 HISTORY — DX: Vitamin D deficiency, unspecified: E55.9

## 2021-05-30 HISTORY — DX: Headache, unspecified: R51.9

## 2021-05-30 HISTORY — DX: Insomnia, unspecified: G47.00

## 2021-05-30 HISTORY — DX: Acute maxillary sinusitis, unspecified: J01.00

## 2021-05-30 HISTORY — DX: Localized edema: R60.0

## 2021-05-30 MED ORDER — GENTAMICIN SULFATE 0.1 % EX CREA: 1.0000 "application " | TOPICAL_CREAM | Freq: Two times a day (BID) | CUTANEOUS | 1 refills | Status: DC

## 2021-05-30 NOTE — Progress Notes (Signed)
° °  Subjective: Patient presents today for evaluation of pain to the bilateral great toes. Patient is concerned for possible ingrown nail.  It is very sensitive to touch.  Patient states that this is been ongoing for a few months.  It has been exacerbated by pedicures.  She has been soaking her feet and applying peroxide with minimal improvement.  Patient presents today for further treatment and evaluation.  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  Medial border bilateral great toes appears to be erythematous with evidence of an ingrowing nail. Pain on palpation noted to the border of the nail fold. The remaining nails appear unremarkable at this time. There are no open sores, lesions.  Vascular: Dorsalis Pedis artery and Posterior Tibial artery pedal pulses palpable. No lower extremity edema noted.   Neruologic: Grossly intact via light touch bilateral.  Musculoskeletal: Muscular strength within normal limits in all groups bilateral. Normal range of motion noted to all pedal and ankle joints.   Assesement: #1 Paronychia with ingrowing nail medial border bilateral great toes #2 Pain in toe  Plan of Care:  1. Patient evaluated.  2. Discussed treatment alternatives and plan of care. Explained nail avulsion procedure and post procedure course to patient. 3. Patient opted for permanent partial nail avulsion of the ingrown portion of the nail.  4. Prior to procedure, local anesthesia  infiltration utilized using 3 ml of a 50:50 mixture of 2% plain lidocaine and 0.5% plain marcaine in a normal hallux block fashion and a betadine prep performed.  5. Partial permanent nail avulsion with chemical matrixectomy performed using [id number] applications of phenol followed by alcohol flush.  6. Light dressing applied.  Post care instructions provided 7.  Prescription for gentamicin 2% cream  8.  Return to clinic 2 weeks.  Edrick Kins, DPM [facility]  Dr. Edrick Kins, DPM    [address], [place] [address]                Office [phone]  Fax [phone]

## 2021-06-13 ENCOUNTER — Ambulatory Visit: Payer: Federal, State, Local not specified - PPO | Admitting: Podiatry

## 2021-06-13 ENCOUNTER — Other Ambulatory Visit: Payer: Self-pay

## 2021-06-13 DIAGNOSIS — L600 Ingrowing nail: Secondary | ICD-10-CM

## 2021-06-13 NOTE — Progress Notes (Signed)
° °  Subjective: 61 y.o. female presents today status post permanent nail avulsion procedure of the medial border of the bilateral great toes that was performed on 05/30/2021.   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 Nail and respective nail fold appears to be healing appropriately. Open wound to the associated nail fold with a granular wound base and moderate amount of fibrotic tissue. Minimal drainage noted. Mild erythema around the periungual region likely due to phenol chemical matricectomy.  Assessment: #1 s/p partial permanent nail matrixectomy medial border bilateral great toes   Plan of care: #1 patient was evaluated  #2 light debridement of open wound was performed to the periungual border of the respective toe using a currette. Antibiotic ointment and Band-Aid was applied. #3 patient is to return to clinic on a PRN basis.   Edrick Kins, DPM [facility]  Dr. Edrick Kins, DPM    [address], [place] [address]                Office [phone]  Fax [phone]

## 2021-07-18 DIAGNOSIS — R0789 Other chest pain: Secondary | ICD-10-CM | POA: Diagnosis not present

## 2021-07-18 DIAGNOSIS — R0602 Shortness of breath: Secondary | ICD-10-CM | POA: Diagnosis not present

## 2021-07-18 DIAGNOSIS — R5383 Other fatigue: Secondary | ICD-10-CM | POA: Diagnosis not present

## 2021-07-18 DIAGNOSIS — E785 Hyperlipidemia, unspecified: Secondary | ICD-10-CM | POA: Diagnosis not present

## 2021-07-18 DIAGNOSIS — H6123 Impacted cerumen, bilateral: Secondary | ICD-10-CM | POA: Diagnosis not present

## 2021-07-18 DIAGNOSIS — R748 Abnormal levels of other serum enzymes: Secondary | ICD-10-CM | POA: Diagnosis not present

## 2021-07-18 DIAGNOSIS — I208 Other forms of angina pectoris: Secondary | ICD-10-CM | POA: Diagnosis not present

## 2021-08-23 DIAGNOSIS — Z0000 Encounter for general adult medical examination without abnormal findings: Secondary | ICD-10-CM | POA: Diagnosis not present

## 2021-09-01 DIAGNOSIS — I10 Essential (primary) hypertension: Secondary | ICD-10-CM | POA: Insufficient documentation

## 2021-09-01 NOTE — Progress Notes (Signed)
?  ?Cardiology Office Note ? ? ?Date:  09/04/2021  ? ?ID:  Yvonne Chapman, DOB 1961/04/07, MRN [id number] ? ?PCP:  Yvonne Frees, MD  ?Cardiologist:   None ?Referring:  Yvonne Balls, NP ? ?Chief Complaint  ?Patient presents with  ? Chest Pain  ? ? ?  ?History of Present Illness: ?Yvonne Chapman is a 61 y.o. female who is following for evaluation of a murmur.  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 She did have an echo in 2014 which was unremarkable.   ? ?She returns because she has had some chest discomfort.  However, turns out this was a 1 day episode of some squeezing inside of her chest.  She was particularly stressed because she has been taking care of her mom who has beginnings of dementia.  She works full-time and she is the only caregiver.  She said that this discomfort happened at rest with emotional stress.  However, she has not had recurrence of this since it happened a few weeks ago.  It was mild to moderate and there were no associated symptoms such as nausea vomiting or diaphoresis.  She did not have any new shortness of breath, PND or orthopnea.  He has not had any new palpitations, presyncope or syncope. ? ? ?Past Medical History:  ?Diagnosis Date  ? Abnormal uterine bleeding   ? Anemia   ? Back pain   ? Bacterial vaginosis   ? Dysmenorrhea   ? Endometriosis   ? Fatty liver   ? Fibroids   ? GERD (gastroesophageal reflux disease)   ? Gonorrhea   ? Herpes   ? History of chicken pox   ? Hypertension   ? Lactose intolerance   ? Liver disorder   ? 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  ? Monilia infection   ? Ovarian cyst   ? Sarcoidosis   ? ? ?Past Surgical History:  ?Procedure Laterality Date  ? ABDOMINAL HYSTERECTOMY    ? DILATION AND CURETTAGE OF UTERUS    ? FOOT SURGERY    ? LAPAROSCOPIC OVARIAN CYSTECTOMY    ? 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    ? 10 years ago  ? WISDOM TOOTH EXTRACTION    ? ? ? ?Current Outpatient Medications  ?Medication Sig Dispense Refill  ? amLODipine (NORVASC) 2.5 MG  tablet Take 2.5 mg by mouth daily.    ? cephALEXin (KEFLEX) 250 MG capsule Take 1 capsule (250 mg total) by mouth 4 (four) times daily. 28 capsule 0  ? Cholecalciferol (VITAMIN D PO) Take by mouth.    ? clotrimazole-betamethasone (LOTRISONE) cream clotrimazole-betamethasone 1 %-0.05 % topical cream    ? Diclofenac Sodium CR 100 MG 24 hr tablet Take 1 tablet (100 mg total) by mouth daily. 10 tablet 0  ? fexofenadine (ALLEGRA) 180 MG tablet 1 tablet    ? Fexofenadine HCl (ALLEGRA PO) Take by mouth.    ? gentamicin cream (GARAMYCIN) 0.1 % Apply 1 application topically 2 (two) times daily. 30 g 1  ? methocarbamol (ROBAXIN) 500 MG tablet methocarbamol 500 mg tablet ? TAKE 1 TABLET BY MOUTH EVERY EIGHT HOURS AS NEEDED FOR MUSCLE SPASMS    ? Multiple Vitamin (MULITIVITAMIN WITH MINERALS) TABS Take 1 tablet by mouth daily.    ? nitrofurantoin, macrocrystal-monohydrate, (MACROBID) 100 MG capsule nitrofurantoin monohydrate/macrocrystals 100 mg capsule ? 1 CAPSULE WITH FOOD ORALLY EVERY 12 HRS 10 DAYS    ? ondansetron (ZOFRAN ODT) 8 MG disintegrating tablet '8mg'$  ODT  q8 hours prn nausea 4 tablet 0  ? ondansetron (ZOFRAN) 4 MG tablet ondansetron HCl 4 mg tablet ? TAKE 1 TABLET BY MOUTH ONCE A DAY AS NEEDED FOR NAUSEA AND VOMITING    ? oxyCODONE (OXY IR/ROXICODONE) 5 MG immediate release tablet oxycodone 5 mg tablet ? TAKE 1 TABLET BY MOUTH EVERY 6 TO 8 HOURS AS NEEDED FOR PAIN    ? phenazopyridine (PYRIDIUM) 200 MG tablet Take 200 mg by mouth every 8 (eight) hours as needed.    ? Probiotic Product (PROBIOTIC-10 PO) Take by mouth.    ? tamsulosin (FLOMAX) 0.4 MG CAPS capsule Take 1 capsule (0.4 mg total) by mouth daily. 10 capsule 0  ? tiZANidine (ZANAFLEX) 2 MG tablet tizanidine 2 mg tablet ? TAKE 1 TABLET BY MOUTH THREE TIMES A DAY AS NEEDED FOR 10 DAYS    ? traMADol (ULTRAM) 50 MG tablet Take 1 tablet (50 mg total) by mouth every 6 (six) hours as needed for severe pain (cannot drive or drink alcohol with this medication). 7  tablet 0  ? ?No current facility-administered medications for this visit.  ? ? ?Allergies:   Amoxicillin, Cefuroxime, Cyclobenzaprine, Diphenhydramine, Elemental sulfur, Floxin [ofloxacin], Metronidazole, Promethazine hcl, and Sulfamethoxazole  ? ?ROS:  Please see the history of present illness.   Otherwise, review of systems are positive for none.   All other systems are reviewed and negative.  ? ? ?PHYSICAL EXAM: ?VS:  BP 134/72 (BP Location: Left Arm, Patient Position: Sitting, Cuff Size: Large)   Pulse (!) 59   Ht '5\' 7"'$  (1.702 m)   Wt 233 lb (105.7 kg)   BMI 36.49 kg/m?  , BMI Body mass index is 36.49 kg/m?. ?GENERAL:  Well appearing ?NECK:  No jugular venous distention, waveform within normal limits, carotid upstroke brisk and symmetric, no bruits, no thyromegaly ?LUNGS:  Clear to auscultation bilaterally ?CHEST:  Unremarkable ?HEART:  PMI not displaced or sustained,S1 and S2 within normal limits, no S3, no S4, no clicks, no rubs, no murmurs ?ABD:  Flat, positive bowel sounds normal in frequency in pitch, no bruits, no rebound, no guarding, no midline pulsatile mass, no hepatomegaly, no splenomegaly ?EXT:  2 plus pulses throughout, no edema, no cyanosis no clubbing ? ? ?EKG:  EKG is ordered today. ?The ekg ordered today demonstrates sinus rhythm, rate 59, axis within normal limits, intervals within normal limits, no acute ST-T wave changes. ? ? ?Recent Labs: ?12/22/2020: ALT 18; BUN 13; Creatinine, Ser 1.25; Hemoglobin 12.9; Platelets 144; Potassium 3.7; Sodium 139  ? ? ?Lipid Panel ?No results found for: CHOL, TRIG, HDL, CHOLHDL, VLDL, LDLCALC, LDLDIRECT ?  ? ?Wt Readings from Last 3 Encounters:  ?09/02/21 233 lb (105.7 kg)  ?01/07/21 227 lb (103 kg)  ?12/22/20 220 lb 14.4 oz (100.2 kg)  ?  ? ? ?Other studies Reviewed: ?Additional studies/ records that were reviewed today include: Labs ?Review of the above records demonstrates:  Please see elsewhere in the note.   ? ? ?ASSESSMENT AND PLAN: ? ?Chest pain:  The patient's chest pain was a one-time event.  It has not been persistent.  She is otherwise had no cardiac history.  She is going to start walking for stress relief and for management of her weight.  If she has recurrent symptoms she will let me know.  At this point however no further cardiovascular testing ? ?HTN:   Her BP is at target.    We discussed this.  She needs to keep a blood pressure  diary.  She first needs weight loss, dietary restriction salt restrictions, increase physical activity.  She will need therapy if she is not at target on a blood pressure diary. ? ? ?Current medicines are reviewed at length with the patient today.  The patient does not have concerns regarding medicines. ? ?The following changes have been made:   ? ?Labs/ tests ordered today include:  ? ?No orders of the defined types were placed in this encounter. ? ? ? ?Disposition:   FU with me in two years.  ? ? ?Signed, ?Minus Breeding, MD  ?09/04/2021 6:44 PM    ?[place] ? ? ? ?

## 2021-09-02 ENCOUNTER — Ambulatory Visit: Payer: Federal, State, Local not specified - PPO | Admitting: Cardiology

## 2021-09-02 ENCOUNTER — Encounter: Payer: Self-pay | Admitting: Cardiology

## 2021-09-02 VITALS — BP 134/72 | HR 59 | Ht 67.0 in | Wt 233.0 lb

## 2021-09-02 DIAGNOSIS — I10 Essential (primary) hypertension: Secondary | ICD-10-CM

## 2021-09-02 DIAGNOSIS — R072 Precordial pain: Secondary | ICD-10-CM | POA: Diagnosis not present

## 2021-09-02 DIAGNOSIS — R011 Cardiac murmur, unspecified: Secondary | ICD-10-CM | POA: Diagnosis not present

## 2021-09-02 NOTE — Patient Instructions (Addendum)
Medication Instructions:  ?Continue same medications ?*If you need a refill on your cardiac medications before your next appointment, please call your pharmacy* ? ? ?Lab Work: ?None ordered ? ? ?Testing/Procedures: ?None ordered ? ? ? ?Follow-Up: ?At [facility], you and your health needs are our priority.  As part of our continuing mission to provide you with exceptional heart care, we have created designated Provider Care Teams.  These Care Teams include your primary Cardiologist (physician) and Advanced Practice Providers (APPs -  Physician Assistants and Nurse Practitioners) who all work together to provide you with the care you need, when you need it. ? ?We recommend signing up for the patient portal called "MyChart".  Sign up information is provided on this After Visit Summary.  MyChart is used to connect with patients for Virtual Visits (Telemedicine).  Patients are able to view lab/test results, encounter notes, upcoming appointments, etc.  Non-urgent messages can be sent to your provider as well.   ?To learn more about what you can do with MyChart, go to [email or web address].   ? ?Your next appointment:  2 years ?  ? ?The format for your next appointment: Office ? ? ?Provider:  Dr.Hochrein ? ? ?Important Information About Sugar ? ? ? ? ? ? ?

## 2021-09-04 ENCOUNTER — Encounter: Payer: Self-pay | Admitting: Cardiology

## 2021-10-25 DIAGNOSIS — G629 Polyneuropathy, unspecified: Secondary | ICD-10-CM | POA: Diagnosis not present

## 2021-10-27 DIAGNOSIS — R5383 Other fatigue: Secondary | ICD-10-CM | POA: Diagnosis not present

## 2021-10-27 DIAGNOSIS — H6692 Otitis media, unspecified, left ear: Secondary | ICD-10-CM | POA: Diagnosis not present

## 2021-10-27 DIAGNOSIS — G629 Polyneuropathy, unspecified: Secondary | ICD-10-CM | POA: Diagnosis not present

## 2021-10-27 DIAGNOSIS — Z20822 Contact with and (suspected) exposure to covid-19: Secondary | ICD-10-CM | POA: Diagnosis not present

## 2021-10-27 DIAGNOSIS — I10 Essential (primary) hypertension: Secondary | ICD-10-CM | POA: Diagnosis not present

## 2021-10-31 DIAGNOSIS — Z1231 Encounter for screening mammogram for malignant neoplasm of breast: Secondary | ICD-10-CM | POA: Diagnosis not present

## 2021-10-31 DIAGNOSIS — J0100 Acute maxillary sinusitis, unspecified: Secondary | ICD-10-CM | POA: Diagnosis not present

## 2021-10-31 DIAGNOSIS — R202 Paresthesia of skin: Secondary | ICD-10-CM | POA: Diagnosis not present

## 2021-10-31 DIAGNOSIS — R52 Pain, unspecified: Secondary | ICD-10-CM | POA: Diagnosis not present

## 2021-11-28 DIAGNOSIS — R051 Acute cough: Secondary | ICD-10-CM | POA: Diagnosis not present

## 2021-11-28 DIAGNOSIS — U071 COVID-19: Secondary | ICD-10-CM | POA: Diagnosis not present

## 2021-12-14 DIAGNOSIS — I728 Aneurysm of other specified arteries: Secondary | ICD-10-CM | POA: Diagnosis not present

## 2021-12-14 DIAGNOSIS — K7469 Other cirrhosis of liver: Secondary | ICD-10-CM | POA: Diagnosis not present

## 2021-12-26 DIAGNOSIS — E8889 Other specified metabolic disorders: Secondary | ICD-10-CM | POA: Diagnosis not present

## 2021-12-26 DIAGNOSIS — Z1331 Encounter for screening for depression: Secondary | ICD-10-CM | POA: Diagnosis not present

## 2021-12-26 DIAGNOSIS — E559 Vitamin D deficiency, unspecified: Secondary | ICD-10-CM | POA: Diagnosis not present

## 2021-12-26 DIAGNOSIS — E785 Hyperlipidemia, unspecified: Secondary | ICD-10-CM | POA: Diagnosis not present

## 2021-12-26 DIAGNOSIS — I10 Essential (primary) hypertension: Secondary | ICD-10-CM | POA: Diagnosis not present

## 2021-12-26 DIAGNOSIS — K7460 Unspecified cirrhosis of liver: Secondary | ICD-10-CM | POA: Diagnosis not present

## 2021-12-28 DIAGNOSIS — D869 Sarcoidosis, unspecified: Secondary | ICD-10-CM | POA: Diagnosis not present

## 2021-12-28 DIAGNOSIS — I728 Aneurysm of other specified arteries: Secondary | ICD-10-CM | POA: Diagnosis not present

## 2021-12-28 DIAGNOSIS — K7469 Other cirrhosis of liver: Secondary | ICD-10-CM | POA: Diagnosis not present

## 2022-01-09 DIAGNOSIS — E559 Vitamin D deficiency, unspecified: Secondary | ICD-10-CM | POA: Diagnosis not present

## 2022-01-09 DIAGNOSIS — K7460 Unspecified cirrhosis of liver: Secondary | ICD-10-CM | POA: Diagnosis not present

## 2022-01-09 DIAGNOSIS — I10 Essential (primary) hypertension: Secondary | ICD-10-CM | POA: Diagnosis not present

## 2022-01-09 DIAGNOSIS — E785 Hyperlipidemia, unspecified: Secondary | ICD-10-CM | POA: Diagnosis not present

## 2022-02-23 DIAGNOSIS — R350 Frequency of micturition: Secondary | ICD-10-CM | POA: Diagnosis not present

## 2022-02-23 DIAGNOSIS — R202 Paresthesia of skin: Secondary | ICD-10-CM | POA: Diagnosis not present

## 2022-02-23 DIAGNOSIS — D869 Sarcoidosis, unspecified: Secondary | ICD-10-CM | POA: Diagnosis not present

## 2022-02-23 DIAGNOSIS — I10 Essential (primary) hypertension: Secondary | ICD-10-CM | POA: Diagnosis not present

## 2022-02-23 DIAGNOSIS — E7800 Pure hypercholesterolemia, unspecified: Secondary | ICD-10-CM | POA: Diagnosis not present

## 2022-03-27 DIAGNOSIS — K7460 Unspecified cirrhosis of liver: Secondary | ICD-10-CM | POA: Diagnosis not present

## 2022-03-28 DIAGNOSIS — K7460 Unspecified cirrhosis of liver: Secondary | ICD-10-CM | POA: Diagnosis not present

## 2022-03-28 DIAGNOSIS — I10 Essential (primary) hypertension: Secondary | ICD-10-CM | POA: Diagnosis not present

## 2022-04-28 IMAGING — CT CT RENAL STONE PROTOCOL
2 of 4 series · 15 of 46 positions shown, 17 images · non-contrast
Comparison: 08/24/2018

CLINICAL DATA: Left flank pain

EXAM:
CT ABDOMEN AND PELVIS WITHOUT CONTRAST
TECHNIQUE: Multidetector CT imaging of the abdomen and pelvis was performed
following the standard protocol without IV contrast.

[Series 2: axial st · axial · 0.87mm/px · z∈[+385,+825]mm · 12 of 98 slices shown, 14 images]
[im 5/98  soft-tissue]
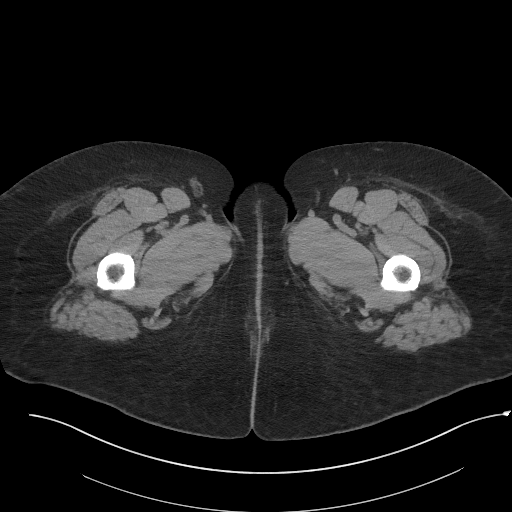
[im 5/98  bone]
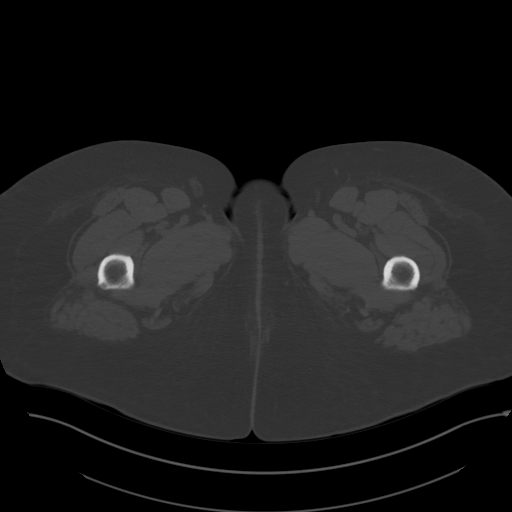
[im 13/98  soft-tissue]
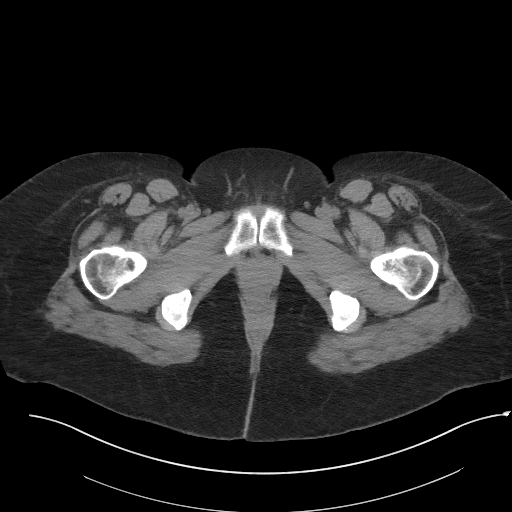
[im 21/98  soft-tissue]
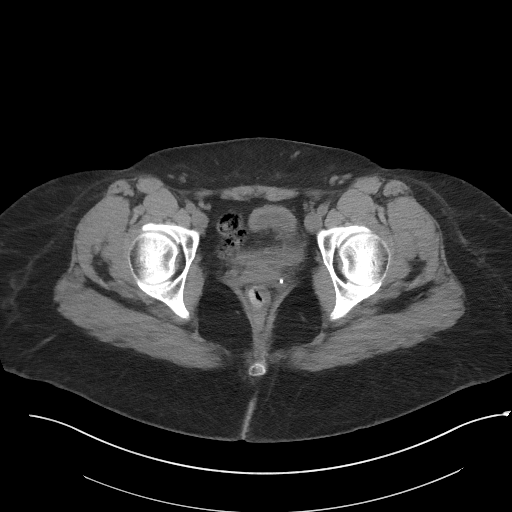
[im 29/98  soft-tissue]
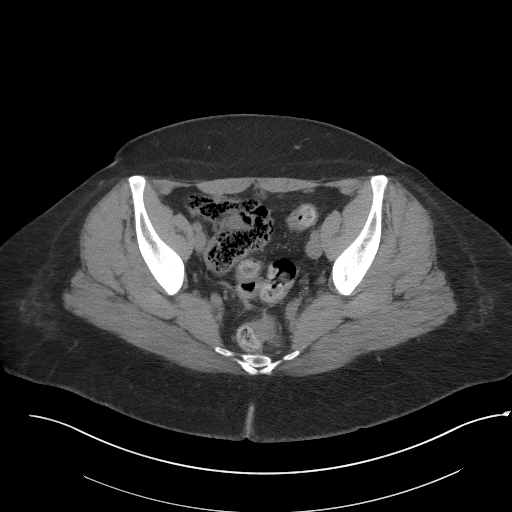
[im 37/98  soft-tissue]
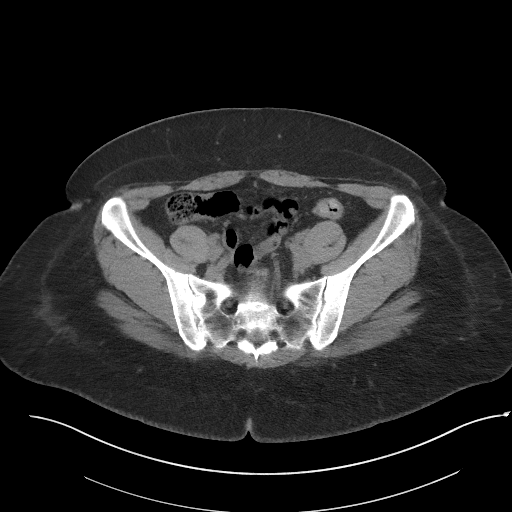
[im 45/98  soft-tissue]
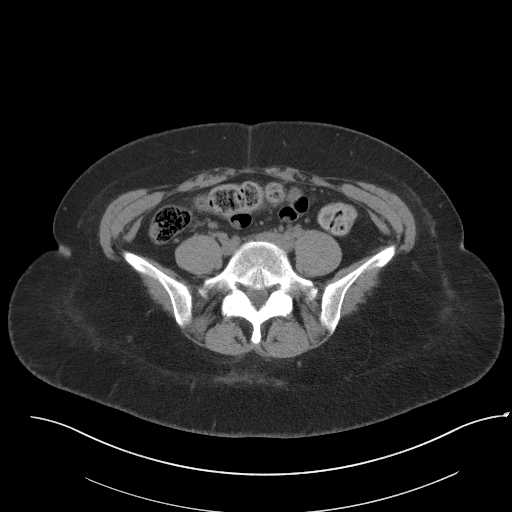
[im 53/98  soft-tissue]
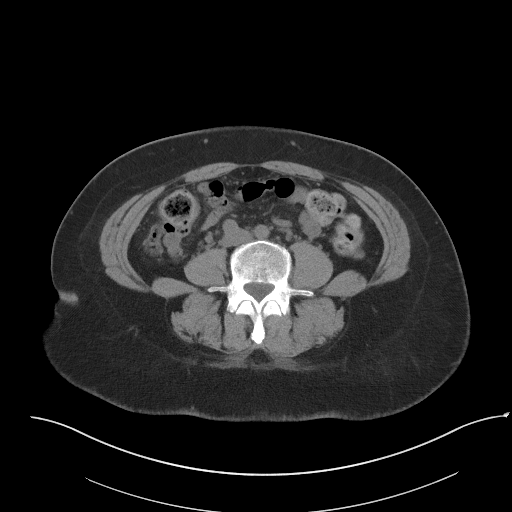
[im 61/98  soft-tissue]
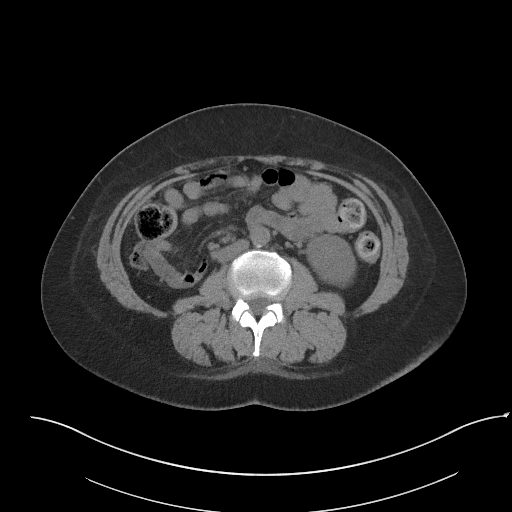
[im 69/98  soft-tissue]
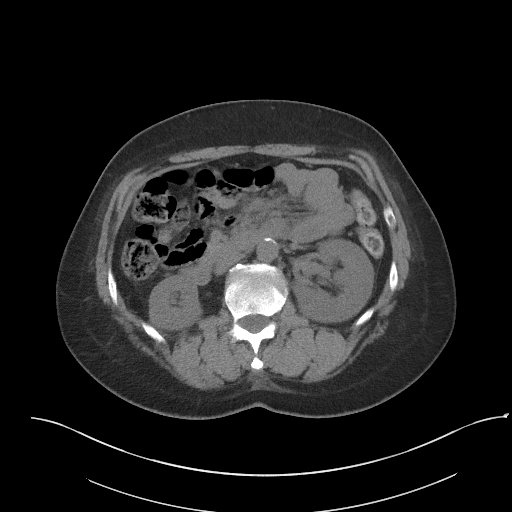
[im 69/98  bone]
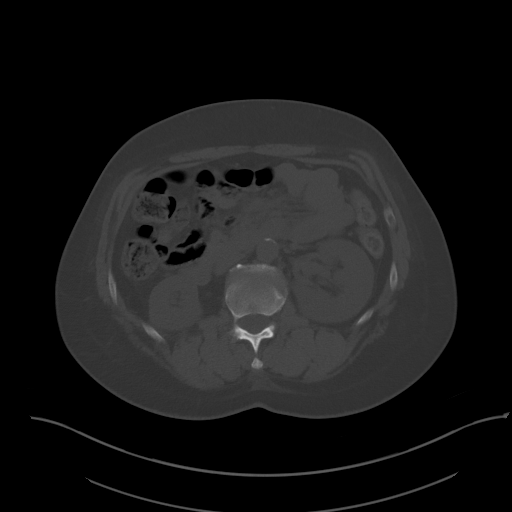
[im 77/98  soft-tissue]
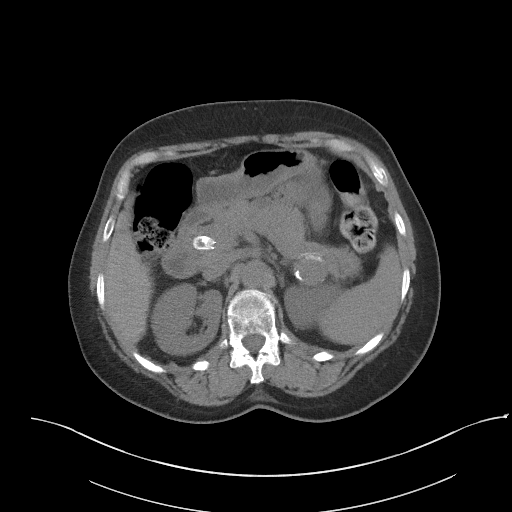
[im 85/98  soft-tissue]
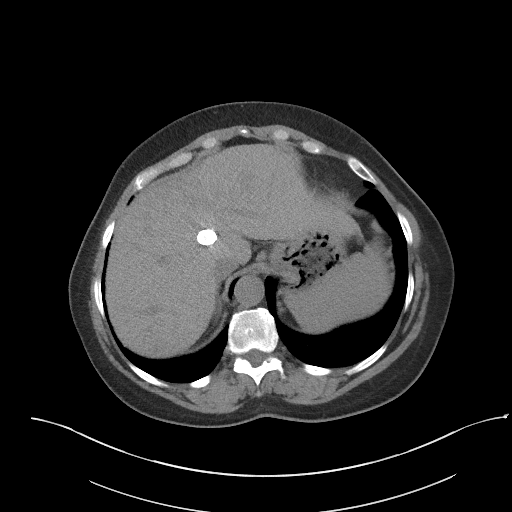
[im 93/98  soft-tissue]
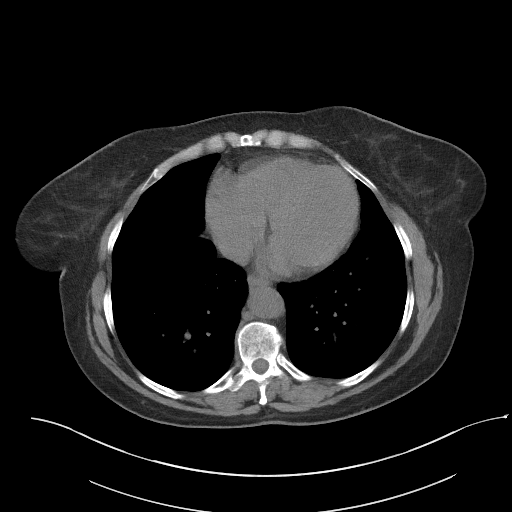

[Series 5: coronal st · coronal · 0.73mm/px · 3 of 101 slices shown]
[im 34/101  soft-tissue]
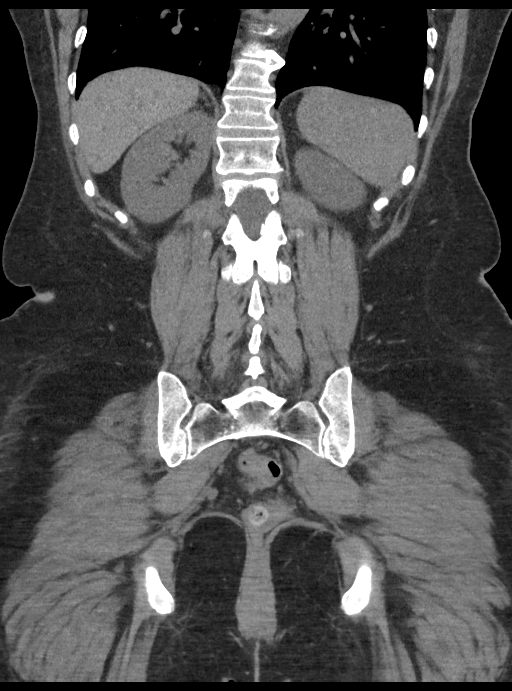
[im 45/101  soft-tissue]
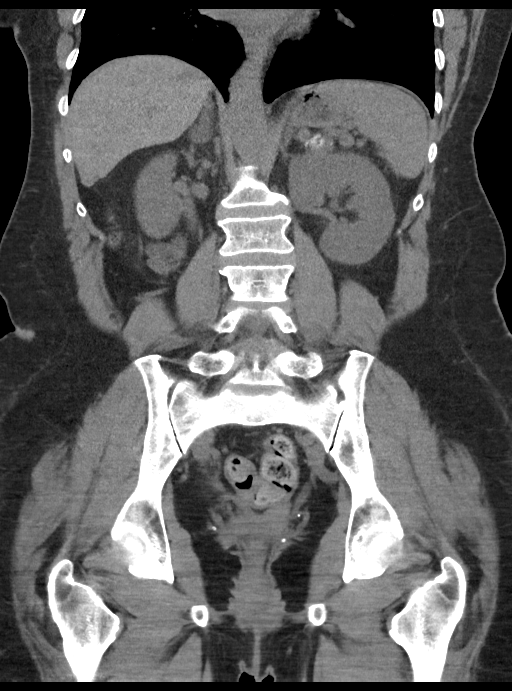
[im 56/101  soft-tissue]
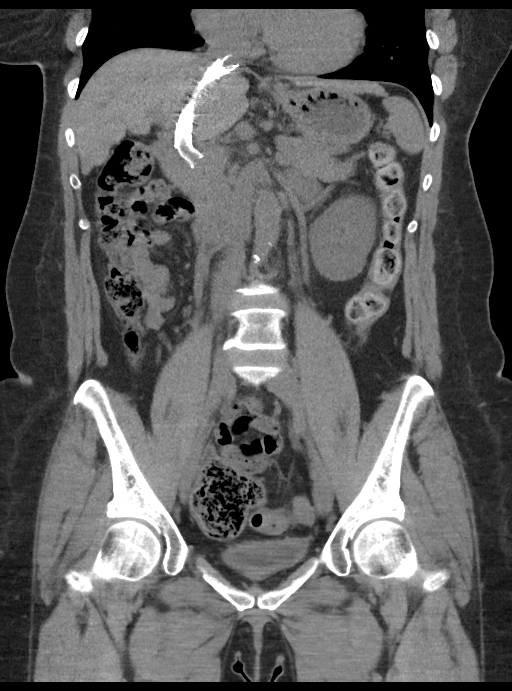

[15 of 46 positions shown; findings below may reference images not displayed]

FINDINGS: Lower chest: No acute abnormality.

Hepatobiliary: Intrahepatic portosystemic shunt is in place. The
liver contour is nodular and the liver is extremely small in size in
keeping with changes of cirrhosis. No definite focal intrahepatic
mass identified on this noncontrast examination. Gallbladder
unremarkable. No intra or extrahepatic biliary ductal dilation.

Pancreas: Unremarkable

Spleen: Unremarkable

Adrenals/Urinary Tract: The adrenal glands are unremarkable. The
kidneys are normal in size and position. Previously noted right UVJ
calculus has resolved, presumably passed in the interval. A 1 mm
nonobstructing calculus is seen within the left ureterovesicular
junction. No additional nephro or urolithiasis. No hydronephrosis.
The bladder is decompressed.

Stomach/Bowel: The stomach, small bowel, and large bowel are
unremarkable. Appendix normal. No free intraperitoneal gas or fluid.

Vascular/Lymphatic: Rim calcified splenic artery aneurysm is
unchanged by my measurement measuring 2.9 x 2.8 x 3.6 cm in greatest
dimension on axial image # 20 and coronal image # 50. This has,
however, enlarged since remote prior examination of 08/12/2008 where
this measured 2.6 cm. Mild aortic atherosclerotic calcification. No
aortic aneurysm. No pathologic adenopathy within the abdomen and
pelvis.

Reproductive: Status post hysterectomy. No adnexal masses.

Other: No abdominal wall hernia.  Rectum unremarkable.

Musculoskeletal: No acute bone abnormality. No lytic or blastic bone
lesion.
IMPRESSION: 1 mm nonobstructing calculus within the left ureterovesicular
junction.

Changes in keeping with advanced cirrhosis. Portosystemic shunt
again noted, unchanged but not optimally evaluated on this
noncontrast examination.

Stable 3.6 cm splenic artery aneurysm. Given its size,
interventional radiology consultation may be helpful for further
management.

## 2022-05-13 ENCOUNTER — Encounter (HOSPITAL_BASED_OUTPATIENT_CLINIC_OR_DEPARTMENT_OTHER): Payer: Self-pay | Admitting: Emergency Medicine

## 2022-05-13 ENCOUNTER — Emergency Department (HOSPITAL_BASED_OUTPATIENT_CLINIC_OR_DEPARTMENT_OTHER)
Admission: EM | Admit: 2022-05-13 | Discharge: 2022-05-13 | Disposition: A | Discharge status: Home / Self Care | Payer: Federal, State, Local not specified - PPO | Attending: Emergency Medicine | Admitting: Emergency Medicine

## 2022-05-13 ENCOUNTER — Other Ambulatory Visit: Payer: Self-pay

## 2022-05-13 ENCOUNTER — Emergency Department (HOSPITAL_BASED_OUTPATIENT_CLINIC_OR_DEPARTMENT_OTHER): Payer: Federal, State, Local not specified - PPO

## 2022-05-13 DIAGNOSIS — N23 Unspecified renal colic: Secondary | ICD-10-CM | POA: Insufficient documentation

## 2022-05-13 DIAGNOSIS — R109 Unspecified abdominal pain: Secondary | ICD-10-CM | POA: Diagnosis not present

## 2022-05-13 DIAGNOSIS — N200 Calculus of kidney: Secondary | ICD-10-CM | POA: Diagnosis not present

## 2022-05-13 DIAGNOSIS — N1330 Unspecified hydronephrosis: Secondary | ICD-10-CM | POA: Diagnosis not present

## 2022-05-13 DIAGNOSIS — K3189 Other diseases of stomach and duodenum: Secondary | ICD-10-CM | POA: Diagnosis not present

## 2022-05-13 DIAGNOSIS — I728 Aneurysm of other specified arteries: Secondary | ICD-10-CM | POA: Diagnosis not present

## 2022-05-13 LAB — CBC WITH DIFFERENTIAL/PLATELET
Abs Immature Granulocytes: 0.01 10*3/uL (ref 0.00–0.07)
Basophils Absolute: 0 10*3/uL (ref 0.0–0.1)
Basophils Relative: 1 %
Eosinophils Absolute: 0 10*3/uL (ref 0.0–0.5)
Eosinophils Relative: 1 %
HCT: 39.9 % (ref 36.0–46.0)
Hemoglobin: 13.4 g/dL (ref 12.0–15.0)
Immature Granulocytes: 0 %
Lymphocytes Relative: 16 %
Lymphs Abs: 0.5 10*3/uL — ABNORMAL LOW (ref 0.7–4.0)
MCH: 27.9 pg (ref 26.0–34.0)
MCHC: 33.6 g/dL (ref 30.0–36.0)
MCV: 83 fL (ref 80.0–100.0)
Monocytes Absolute: 0.4 10*3/uL (ref 0.1–1.0)
Monocytes Relative: 12 %
Neutro Abs: 2.2 10*3/uL (ref 1.7–7.7)
Neutrophils Relative %: 70 %
Platelets: 155 10*3/uL (ref 150–400)
RBC: 4.81 MIL/uL (ref 3.87–5.11)
RDW: 13.2 % (ref 11.5–15.5)
WBC: 3.2 10*3/uL — ABNORMAL LOW (ref 4.0–10.5)
nRBC: 0 % (ref 0.0–0.2)

## 2022-05-13 LAB — URINALYSIS, ROUTINE W REFLEX MICROSCOPIC
Bilirubin Urine: NEGATIVE
Glucose, UA: NEGATIVE mg/dL
Ketones, ur: NEGATIVE mg/dL
Nitrite: NEGATIVE
Protein, ur: NEGATIVE mg/dL
Specific Gravity, Urine: 1.02 (ref 1.005–1.030)
pH: 7 (ref 5.0–8.0)

## 2022-05-13 LAB — URINALYSIS, MICROSCOPIC (REFLEX)

## 2022-05-13 LAB — COMPREHENSIVE METABOLIC PANEL
ALT: 16 U/L (ref 0–44)
AST: 36 U/L (ref 15–41)
Albumin: 3.2 g/dL — ABNORMAL LOW (ref 3.5–5.0)
Alkaline Phosphatase: 119 U/L (ref 38–126)
Anion gap: 8 (ref 5–15)
BUN: 14 mg/dL (ref 8–23)
CO2: 25 mmol/L (ref 22–32)
Calcium: 8.5 mg/dL — ABNORMAL LOW (ref 8.9–10.3)
Chloride: 107 mmol/L (ref 98–111)
Creatinine, Ser: 1.06 mg/dL — ABNORMAL HIGH (ref 0.44–1.00)
GFR, Estimated: 60 mL/min — ABNORMAL LOW (ref >60)
Glucose, Bld: 97 mg/dL (ref 70–99)
Potassium: 3.2 mmol/L — ABNORMAL LOW (ref 3.5–5.1)
Sodium: 140 mmol/L (ref 135–145)
Total Bilirubin: 0.9 mg/dL (ref 0.3–1.2)
Total Protein: 6.7 g/dL (ref 6.5–8.1)

## 2022-05-13 LAB — LIPASE, BLOOD: Lipase: 28 U/L (ref 11–51)

## 2022-05-13 LAB — PROTIME-INR
INR: 1.2 (ref 0.8–1.2)
Prothrombin Time: 14.6 seconds (ref 11.4–15.2)

## 2022-05-13 MED ORDER — OXYCODONE HCL 5 MG PO TABS: 5.0000 mg | ORAL_TABLET | Freq: Four times a day (QID) | ORAL | 0 refills | Status: DC | PRN

## 2022-05-13 MED ORDER — KETOROLAC TROMETHAMINE 15 MG/ML IJ SOLN
15.0000 mg | Freq: Once | INTRAMUSCULAR | Status: AC
  Administered 2022-05-13: 15 mg via INTRAVENOUS
  Filled 2022-05-13: qty 1

## 2022-05-13 MED ORDER — POTASSIUM CHLORIDE CRYS ER 20 MEQ PO TBCR
20.0000 meq | EXTENDED_RELEASE_TABLET | Freq: Once | ORAL | Status: AC
  Administered 2022-05-13: 20 meq via ORAL
  Filled 2022-05-13: qty 1

## 2022-05-13 NOTE — ED Triage Notes (Signed)
Pt is c/o left lower quadrant pain and lower back pain that started about midnight tonight   Pt states she took some tylenol with out any relief

## 2022-05-13 NOTE — ED Provider Notes (Signed)
[facility] EMERGENCY DEPARTMENT Provider Note   CSN: [id number] Arrival date & time: 05/13/22  0429     History  Chief Complaint  Patient presents with   Abdominal Pain   Back Pain    Yvonne Chapman is a 62 y.o. female.  The history is provided by the patient and medical records.  Abdominal Pain Back Pain Associated symptoms: abdominal pain   Yvonne Chapman is a 62 y.o. female who presents to the Emergency Department complaining of left-sided pain.  She presents to the emergency department for evaluation of left lower quadrant and left side pain that started around midnight.  She took Tylenol and eat prunes without improvement in her pain.  No associated fever.  She has nausea.  She feels cold.  No chills, sore throat, runny nose.  No known sick contacts.  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  Pain feels like prior kidney stones.     Home Medications Prior to Admission medications   Medication Sig Start Date End Date Taking? Authorizing Provider  oxyCODONE (ROXICODONE) 5 MG immediate release tablet Take 1 tablet (5 mg total) by mouth every 6 (six) hours as needed for severe pain. 05/13/22  Yes Quintella Reichert, MD  amLODipine (NORVASC) 2.5 MG tablet Take 2.5 mg by mouth daily. 08/23/21   [provider]  cephALEXin (KEFLEX) 250 MG capsule Take 1 capsule (250 mg total) by mouth 4 (four) times daily. 12/20/20   Palumbo, April, MD  Cholecalciferol (VITAMIN D PO) Take by mouth.    [provider]  clotrimazole-betamethasone (LOTRISONE) cream clotrimazole-betamethasone 1 %-0.05 % topical cream    [provider]  Diclofenac Sodium CR 100 MG 24 hr tablet Take 1 tablet (100 mg total) by mouth daily. 12/20/20   Palumbo, April, MD  fexofenadine (ALLEGRA) 180 MG tablet 1 tablet    [provider]  Fexofenadine HCl (ALLEGRA PO) Take by mouth.    [provider]  gentamicin cream (GARAMYCIN)  0.1 % Apply 1 application topically 2 (two) times daily. 05/30/21   Edrick Kins, DPM  methocarbamol (ROBAXIN) 500 MG tablet methocarbamol 500 mg tablet  TAKE 1 TABLET BY MOUTH EVERY EIGHT HOURS AS NEEDED FOR MUSCLE SPASMS    [provider]  Multiple Vitamin (MULITIVITAMIN WITH MINERALS) TABS Take 1 tablet by mouth daily.    [provider]  nitrofurantoin, macrocrystal-monohydrate, (MACROBID) 100 MG capsule nitrofurantoin monohydrate/macrocrystals 100 mg capsule  1 CAPSULE WITH FOOD ORALLY EVERY 12 HRS 10 DAYS    [provider]  ondansetron (ZOFRAN ODT) 8 MG disintegrating tablet '8mg'$  ODT q8 hours prn nausea 12/20/20   Palumbo, April, MD  ondansetron (ZOFRAN) 4 MG tablet ondansetron HCl 4 mg tablet  TAKE 1 TABLET BY MOUTH ONCE A DAY AS NEEDED FOR NAUSEA AND VOMITING    [provider]  phenazopyridine (PYRIDIUM) 200 MG tablet Take 200 mg by mouth every 8 (eight) hours as needed. 12/29/20   [provider]  Probiotic Product (PROBIOTIC-10 PO) Take by mouth.    [provider]  tamsulosin (FLOMAX) 0.4 MG CAPS capsule Take 1 capsule (0.4 mg total) by mouth daily. 12/20/20   Palumbo, April, MD  tiZANidine (ZANAFLEX) 2 MG tablet tizanidine 2 mg tablet  TAKE 1 TABLET BY MOUTH THREE TIMES A DAY AS NEEDED FOR 10 DAYS    [provider]  traMADol (ULTRAM) 50 MG tablet Take 1 tablet (50 mg total) by mouth  every 6 (six) hours as needed for severe pain (cannot drive or drink alcohol with this medication). 12/20/20   Palumbo, April, MD      Allergies    Amoxicillin, Cefuroxime, Cyclobenzaprine, Diphenhydramine, Elemental sulfur, Floxin [ofloxacin], Metronidazole, Promethazine hcl, and Sulfamethoxazole    Review of Systems   Review of Systems  Gastrointestinal:  Positive for abdominal pain.  Musculoskeletal:  Positive for back pain.  All other systems reviewed and are negative.   Physical Exam Updated Vital Signs BP (!) 151/65   Pulse 80    Temp 98.8 F (37.1 C) (Oral)   Resp 16   Ht '5\' 7"'$  (1.702 m)   Wt 103.4 kg   SpO2 97%   BMI 35.71 kg/m  Physical Exam Vitals and nursing note reviewed.  Constitutional:      Appearance: She is well-developed.  HENT:     Head: Normocephalic and atraumatic.  Cardiovascular:     Rate and Rhythm: Normal rate and regular rhythm.  Pulmonary:     Effort: Pulmonary effort is normal. No respiratory distress.  Abdominal:     Palpations: Abdomen is soft.     Tenderness: There is no abdominal tenderness. There is no guarding or rebound.  Musculoskeletal:        General: No swelling or tenderness.     Comments: 2+ DP pulses  Skin:    General: Skin is warm and dry.  Neurological:     Mental Status: She is alert and oriented to person, place, and time.  Psychiatric:        Behavior: Behavior normal.     ED Results / Procedures / Treatments   Labs (all labs ordered are listed, but only abnormal results are displayed) Labs Reviewed  URINALYSIS, ROUTINE W REFLEX MICROSCOPIC - Abnormal; Notable for the following components:      Result Value   Hgb urine dipstick TRACE (*)    Leukocytes,Ua TRACE (*)    All other components within normal limits  URINALYSIS, MICROSCOPIC (REFLEX) - Abnormal; Notable for the following components:   Bacteria, UA FEW (*)    All other components within normal limits  COMPREHENSIVE METABOLIC PANEL - Abnormal; Notable for the following components:   Potassium 3.2 (*)    Creatinine, Ser 1.06 (*)    Calcium 8.5 (*)    Albumin 3.2 (*)    GFR, Estimated 60 (*)    All other components within normal limits  CBC WITH DIFFERENTIAL/PLATELET - Abnormal; Notable for the following components:   WBC 3.2 (*)    Lymphs Abs 0.5 (*)    All other components within normal limits  URINE CULTURE  LIPASE, BLOOD  PROTIME-INR    EKG None  Radiology CT Renal Stone Study  Result Date: 05/13/2022 CLINICAL DATA:  62 year old female with left lower quadrant, left flank and  back pain since midnight. EXAM: CT ABDOMEN AND PELVIS WITHOUT CONTRAST TECHNIQUE: Multidetector CT imaging of the abdomen and pelvis was performed following the standard protocol without IV contrast. RADIATION DOSE REDUCTION: This exam was performed according to the departmental dose-optimization program which includes automated exposure control, adjustment of the mA and/or kV according to patient size and/or use of iterative reconstruction technique. COMPARISON:  CTA abdomen and Pelvis 12/22/2020 and earlier. FINDINGS: Lower chest: Negative lung bases, borderline to mild cardiomegaly. No pericardial or pleural effusion. Hepatobiliary: Chronic tips. Otherwise stable and negative noncontrast liver and gallbladder. Pancreas: Negative. Spleen: Stable noncontrast spleen and a rim calcified 3.5 cm splenic artery aneurysm (series 2,  image 17). Aneurysm size and configuration not significantly changed since the 2020 CT Abdomen and Pelvis. No perisplenic fluid. Adrenals/Urinary Tract: Negative adrenal glands. Some chronic asymmetry of renal size, greater on the left. With perhaps subtle left hydronephrosis and renal inflammation on this noncontrast exam. Punctate left nephrolithiasis. Left ureter appears to be decompressed and within normal limits into the pelvis. However, at the left ureterovesical junction there is a chronic punctate calculus redemonstrated on series 2, image 73 and also coronal image 52. Nearby chronic pelvic phleboliths. Negative noncontrast right kidney and right ureter. Otherwise negative bladder. Stomach/Bowel: Large bowel redundancy and retained stool. No large bowel inflammation. Normal retrocecal appendix on series 2, image 55. Flocculated material in the terminal ileum which otherwise appears negative. No dilated small bowel. Fairly decompressed stomach and duodenum. No free air or free fluid. Vascular/Lymphatic: Aortoiliac calcified atherosclerosis. Normal caliber abdominal aorta, negative for  abdominal aortic aneurysm. Chronic TIPS. Vascular patency is not evaluated in the absence of IV contrast. No lymphadenopathy identified. Reproductive: Surgically absent uterus. Diminutive or absent ovaries. Other: No pelvic free fluid.  Incidental pelvic phleboliths. Musculoskeletal: No acute osseous abnormality identified. IMPRESSION: 1. A chronic 1-2 mm left ureterovesical junction stone persists since 2022, and is associated with some chronic left nephromegaly. Difficult to exclude a mild acute component of left side obstructive uropathy and/or renal inflammation on this noncontrast exam. 2. No superimposed acute or inflammatory process identified in the non-contrast abdomen or pelvis. Chronic TIPS. 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 Aortic Atherosclerosis (ICD10-I70.0). Electronically Signed   By: Genevie Ann M.D.   On: 05/13/2022 06:49    Procedures Procedures    Medications Ordered in ED Medications  ketorolac (TORADOL) 15 MG/ML injection 15 mg (15 mg Intravenous Given 05/13/22 0557)  potassium chloride SA (KLOR-CON M) CR tablet 20 mEq (20 mEq Oral Given 05/13/22 3244)    ED Course/ Medical Decision Making/ A&P                             Medical Decision Making Amount and/or Complexity of Data Reviewed Labs: ordered. Radiology: ordered.  Risk Prescription drug management.   Patient here for evaluation of left lower quadrant abdominal pain, has a history of prior cirrhosis status post TIPS procedure.  No significant tenderness on examination.  UA is not consistent with UTI.  CT stone study was obtained, which demonstrates a chronic 1 to 2 mm stone.  Labs with baseline renal function, hepatic function.  CBC with minimal leukopenia.  Current clinical picture is not consistent with infected stone, renal infarction or renal abscess, diverticulitis.  Discussed with patient home care for likely recurrent ureteral colic.  Will prescribe oxycodone if her  pain recurs.  Discussed close her urology follow-up as well as return precautions.        Final Clinical Impression(s) / ED Diagnoses Final diagnoses:  Ureteral colic    Rx / DC Orders ED Discharge Orders          Ordered    oxyCODONE (ROXICODONE) 5 MG immediate release tablet  Every 6 hours PRN        05/13/22 0716              Quintella Reichert, MD 05/13/22 [phone]

## 2022-05-14 LAB — URINE CULTURE: Culture: <10000 — AB

## 2022-05-18 DIAGNOSIS — R8271 Bacteriuria: Secondary | ICD-10-CM | POA: Diagnosis not present

## 2022-05-18 DIAGNOSIS — N201 Calculus of ureter: Secondary | ICD-10-CM | POA: Diagnosis not present

## 2022-05-23 ENCOUNTER — Other Ambulatory Visit: Payer: Self-pay | Admitting: Urology

## 2022-05-26 NOTE — Progress Notes (Signed)
Anesthesia Review:  PCP: Cardiologist : Chest x-ray : EKG : 09/07/21  Echo : Stress test: Cardiac Cath :  Activity level:  Sleep Study/ CPAP : Fasting Blood Sugar :      / Checks Blood Sugar -- times a day:   Blood Thinner/ Instructions /Last Dose: ASA / Instructions/ Last Dose :    05/13/22- PTINR, cbc/diff and cmp done  In ED 05/13/22

## 2022-05-26 NOTE — Patient Instructions (Signed)
SURGICAL WAITING ROOM VISITATION  Patients having surgery or a procedure may have no more than 2 support people in the waiting area - these visitors may rotate.    Children under the age of 56 must have an adult with them who is not the patient.  Due to an increase in RSV and influenza rates and associated hospitalizations, children ages 65 and under may not visit patients in [place].  If the patient needs to stay at the hospital during part of their recovery, the visitor guidelines for inpatient rooms apply. Pre-op nurse will coordinate an appropriate time for 1 support person to accompany patient in pre-op.  This support person may not rotate.    Please refer to the [facility] website for the visitor guidelines for Inpatients (after your surgery is over and you are in a regular room).       Your procedure is scheduled on:  06/06/22    Report to [facility] Main Entrance    Report to admitting at   0700AM   Call this number if you have problems the morning of surgery [phone]   Do not eat food  or dirnk liquids After Midnight.                                                  If you have questions, please contact your surgeon's office.       Oral Hygiene is also important to reduce your risk of infection.                                    Remember - BRUSH YOUR TEETH THE MORNING OF SURGERY WITH YOUR REGULAR TOOTHPASTE  DENTURES WILL BE REMOVED PRIOR TO SURGERY PLEASE DO NOT APPLY "Poly grip" OR ADHESIVES!!!   Do NOT smoke after Midnight   Take these medicines the morning of surgery with A SIP OF WATER:  none   DO NOT TAKE ANY ORAL DIABETIC MEDICATIONS DAY OF YOUR SURGERY  Bring CPAP mask and tubing day of surgery.                              You may not have any metal on your body including hair pins, jewelry, and body piercing             Do not wear make-up, lotions, powders, perfumes/cologne, or deodorant  Do not wear nail polish  including gel and S&S, artificial/acrylic nails, or any other type of covering on natural nails including finger and toenails. If you have artificial nails, gel coating, etc. that needs to be removed by a nail salon please have this removed prior to surgery or surgery may need to be canceled/ delayed if the surgeon/ anesthesia feels like they are unable to be safely monitored.   Do not shave  48 hours prior to surgery.               Men may shave face and neck.   Do not bring valuables to the hospital. [place].   Contacts, glasses, dentures or bridgework may not be worn into surgery.  Bring small overnight bag day of surgery.   DO NOT [place]. PHARMACY WILL DISPENSE MEDICATIONS LISTED ON YOUR MEDICATION LIST TO YOU DURING YOUR ADMISSION [place]!    Patients discharged on the day of surgery will not be allowed to drive home.  Someone NEEDS to stay with you for the first 24 hours after anesthesia.   Special Instructions: Bring a copy of your healthcare power of attorney and living will documents the day of surgery if you haven't scanned them before.              Please read over the following fact sheets you were given: IF [place] [phone]   If you received a COVID test during your pre-op visit  it is requested that you wear a mask when out in public, stay away from anyone that may not be feeling well and notify your surgeon if you develop symptoms. If you test positive for Covid or have been in contact with anyone that has tested positive in the last 10 days please notify you surgeon.    [place] - Preparing for Surgery Before surgery, you can play an important role.  Because skin is not sterile, your skin needs to be as free of germs as possible.  You can reduce the number of germs on your skin by washing with CHG (chlorahexidine  gluconate) soap before surgery.  CHG is an antiseptic cleaner which kills germs and bonds with the skin to continue killing germs even after washing. Please DO NOT use if you have an allergy to CHG or antibacterial soaps.  If your skin becomes reddened/irritated stop using the CHG and inform your nurse when you arrive at Short Stay. Do not shave (including legs and underarms) for at least 48 hours prior to the first CHG shower.  You may shave your face/neck. Please follow these instructions carefully:  1.  Shower with CHG Soap the night before surgery and the  morning of Surgery.  2.  If you choose to wash your hair, wash your hair first as usual with your  normal  shampoo.  3.  After you shampoo, rinse your hair and body thoroughly to remove the  shampoo.                           4.  Use CHG as you would any other liquid soap.  You can apply chg directly  to the skin and wash                       Gently with a scrungie or clean washcloth.  5.  Apply the CHG Soap to your body ONLY FROM THE NECK DOWN.   Do not use on face/ open                           Wound or open sores. Avoid contact with eyes, ears mouth and genitals (private parts).                       Wash face,  Genitals (private parts) with your normal soap.             6.  Wash thoroughly, paying special attention to the area where your surgery  will be performed.  7.  Thoroughly rinse your body  with warm water from the neck down.  8.  DO NOT shower/wash with your normal soap after using and rinsing off  the CHG Soap.                9.  Pat yourself dry with a clean towel.            10.  Wear clean pajamas.            11.  Place clean sheets on your bed the night of your first shower and do not  sleep with pets. Day of Surgery : Do not apply any lotions/deodorants the morning of surgery.  Please wear clean clothes to the hospital/surgery center.  FAILURE TO FOLLOW THESE INSTRUCTIONS MAY RESULT IN THE CANCELLATION OF YOUR  SURGERY PATIENT SIGNATURE_________________________________  NURSE SIGNATURE__________________________________  ________________________________________________________________________

## 2022-05-30 ENCOUNTER — Encounter (HOSPITAL_COMMUNITY): Payer: Self-pay

## 2022-05-30 ENCOUNTER — Encounter (HOSPITAL_COMMUNITY)
Admission: RE | Admit: 2022-05-30 | Discharge: 2022-05-30 | Disposition: A | Discharge status: Home / Self Care | Payer: Federal, State, Local not specified - PPO | Source: Ambulatory Visit | Attending: Urology | Admitting: Urology

## 2022-05-30 ENCOUNTER — Other Ambulatory Visit: Payer: Self-pay | Admitting: Urology

## 2022-05-30 ENCOUNTER — Other Ambulatory Visit: Payer: Self-pay

## 2022-05-30 VITALS — BP 142/84 | Temp 97.9°F | Resp 16 | Ht 67.0 in | Wt 228.0 lb

## 2022-05-30 DIAGNOSIS — Z01818 Encounter for other preprocedural examination: Secondary | ICD-10-CM

## 2022-05-30 DIAGNOSIS — Z01812 Encounter for preprocedural laboratory examination: Secondary | ICD-10-CM | POA: Diagnosis not present

## 2022-05-30 HISTORY — DX: Unspecified osteoarthritis, unspecified site: M19.90

## 2022-05-30 HISTORY — DX: Personal history of urinary calculi: Z87.442

## 2022-05-30 HISTORY — DX: Cardiac murmur, unspecified: R01.1

## 2022-05-30 HISTORY — DX: Nausea with vomiting, unspecified: R11.2

## 2022-05-30 HISTORY — DX: Other specified postprocedural states: Z98.890

## 2022-05-30 LAB — COMPREHENSIVE METABOLIC PANEL
ALT: 17 U/L (ref 0–44)
AST: 32 U/L (ref 15–41)
Albumin: 3.4 g/dL — ABNORMAL LOW (ref 3.5–5.0)
Alkaline Phosphatase: 115 U/L (ref 38–126)
Anion gap: 9 (ref 5–15)
BUN: 12 mg/dL (ref 8–23)
CO2: 26 mmol/L (ref 22–32)
Calcium: 8.7 mg/dL — ABNORMAL LOW (ref 8.9–10.3)
Chloride: 106 mmol/L (ref 98–111)
Creatinine, Ser: 0.73 mg/dL (ref 0.44–1.00)
GFR, Estimated: >60 mL/min (ref >60)
Glucose, Bld: 75 mg/dL (ref 70–99)
Potassium: 3.5 mmol/L (ref 3.5–5.1)
Sodium: 141 mmol/L (ref 135–145)
Total Bilirubin: 0.9 mg/dL (ref 0.3–1.2)
Total Protein: 7.1 g/dL (ref 6.5–8.1)

## 2022-05-30 LAB — CBC
HCT: 40.9 % (ref 36.0–46.0)
Hemoglobin: 13 g/dL (ref 12.0–15.0)
MCH: 27.7 pg (ref 26.0–34.0)
MCHC: 31.8 g/dL (ref 30.0–36.0)
MCV: 87.2 fL (ref 80.0–100.0)
Platelets: 177 10*3/uL (ref 150–400)
RBC: 4.69 MIL/uL (ref 3.87–5.11)
RDW: 13.5 % (ref 11.5–15.5)
WBC: 3.2 10*3/uL — ABNORMAL LOW (ref 4.0–10.5)
nRBC: 0 % (ref 0.0–0.2)

## 2022-05-31 NOTE — H&P (Signed)
CC/HPI: CC: History of right ureteral calculus  HPI:  08/28/2018  Patient went to the emergency department on 08/24/2018. She had a CT scan performed there, which revealed a distal right punctate ureterovesicular junction calculus. She was having severe right-sided flank pain At the time. Creatinine was 0.64. Hemoglobin 11.9. No leukocytosis. She has been straining her urine, about 90% of the time. She has not seen a stone pass. Her pain has resolved. She has no microscopic hematuria today. Her main complaint is actually lower back pain and left-sided abdominal pain. She states it is worse with movement and turning her head. It is also worse with certain positioning.   09/12/2018  Patient no longer has any complaints. No pain. No hematuria or dysuria. Urinalysis is negative for hematuria. Renal ultrasound today showed resolution of right-sided hydronephrosis.   01/14/2021  Patient went to the emergency department on 12/20/2020 with flank pain. She was found to have a tiny 1 mm ureteral vesicular junction calculus. She had another CT 2 days later and it looked like the stone was in the bladder. This 2nd CT was a CTA. She is no longer having any flank pain. No hematuria on urinalysis. She did not see a stone passed but the stone was tiny.   05/18/22: Yvonne Chapman is a 62 year old female who presents today with concerns of LEFT flank pain and discomfort that began approximately 1 week ago. This is associated with nausea. She is also having significant fatigue. She presented to the emergency department and CT imaging was performed. This did show a chronic LEFT-sided stone with some mild hydro-. She denies fevers and chills. She is having pain and nausea.     ALLERGIES: SULFA    MEDICATIONS: Amlodipine Besylate 2.5 mg tablet  Multivitamin  Vitamin D2     GU PSH: No GU PSH    NON-GU PSH: Hysterectomy Liver surgery     GU PMH: Ureteral calculus - 01/14/2021, - 2020 Low back pain - 2020    NON-GU  PMH: Liver Disease    FAMILY HISTORY: Heart problem - Mother High Blood Pressure - Runs in Family Prostate Cancer - Father   SOCIAL HISTORY: Marital Status: Single Preferred Language: English; Ethnicity: Not Hispanic Or Latino; Race: Black or African American Current Smoking Status: Patient has never smoked.   Tobacco Use Assessment Completed: Used Tobacco in last 30 days? Has never drank.  Does not use drugs. Drinks 2 caffeinated drinks per day. Patient's occupation Customer service manager.    REVIEW OF SYSTEMS:    GU Review Female:   Patient reports frequent urination. Patient denies hard to postpone urination, burning /pain with urination, get up at night to urinate, leakage of urine, stream starts and stops, trouble starting your stream, have to strain to urinate, and being pregnant.  Gastrointestinal (Upper):   Patient reports nausea. Patient denies vomiting and indigestion/ heartburn.  Gastrointestinal (Lower):   Patient denies diarrhea and constipation.  Constitutional:   Patient reports fatigue. Patient denies fever, night sweats, and weight loss.  Skin:   Patient denies skin rash/ lesion and itching.  Eyes:   Patient denies blurred vision and double vision.  Musculoskeletal:   Patient denies back pain and joint pain.  Neurological:   Patient denies headaches and dizziness.  Psychologic:   Patient denies depression and anxiety.   VITAL SIGNS:      05/18/2022 09:02 AM  Weight 227 lb / 102.97 kg  Height 67 in / 170.18 cm  BP 110/68 mmHg  Pulse  71 /min  BMI 35.5 kg/m   MULTI-SYSTEM PHYSICAL EXAMINATION:    Constitutional: Well-nourished. No physical deformities. Normally developed. Good grooming.  Respiratory: No labored breathing, no use of accessory muscles.   Cardiovascular: Normal temperature, normal extremity pulses, no swelling, no varicosities.  Skin: No paleness, no jaundice, no cyanosis. No lesion, no ulcer, no rash.  Neurologic / Psychiatric: Oriented to  time, oriented to place, oriented to person. No depression, no anxiety, no agitation.  Gastrointestinal: No mass, no tenderness, no rigidity, non obese abdomen.     Complexity of Data:  Source Of History:  Patient  Records Review:   Previous Doctor Records, Previous Hospital Records, Previous Patient Records  Urine Test Review:   Urinalysis  X-Ray Review: C.T. Abdomen/Pelvis: Reviewed Films. Reviewed Report. Discussed With Patient.     PROCEDURES:          Urinalysis w/Scope Dipstick Dipstick Cont'd Micro  Color: Amber Bilirubin: Neg mg/dL WBC/hpf: 6 - 10/hpf  Appearance: Cloudy Ketones: Neg mg/dL RBC/hpf: NS (Not Seen)  Specific Gravity: 1.025 Blood: Neg ery/uL Bacteria: Rare (0-9/hpf)  pH: 6.0 Protein: 1+ mg/dL Cystals: NS (Not Seen)  Glucose: Neg mg/dL Urobilinogen: 1.0 mg/dL Casts: Hyaline    Nitrites: Neg Trichomonas: Not Present    Leukocyte Esterase: Trace leu/uL Mucous: Present      Epithelial Cells: 0 - 5/hpf      Yeast: NS (Not Seen)      Sperm: Not Present    ASSESSMENT:      ICD-10 Details  1 GU:   Ureteral calculus - N20.1 Right, Chronic, Exacerbation   PLAN:           Orders Labs CULTURE, URINE          Schedule Return Visit/Planned Activity: Next Available Appointment - Schedule Surgery          Document Letter(s):  Created for Patient: Clinical Summary         Notes:   Urine was sent for precautionary culture today. Urinalysis will be sent for precautionary culture today. Stone intervention was discussed in detail today. For ureteroscopy, the patient understands that there is a chance for a staged procedure. Patient also understands that there is risk for bleeding, infection, injury to surrounding organs, and general risks of anesthesia. The patient also understands the placement of a stent and the risks of stent placement including, risk for infection, the risk for pain, and the risk for injury. She was agreeable to proceed with a ureteroscopy. A green  sheet was placed today. Nausea and pain medication were sent to her pharmacy. She would like to follow with Dr. Claudia Desanctis.   This will be for a LEFT URS.

## 2022-06-05 DIAGNOSIS — Z01419 Encounter for gynecological examination (general) (routine) without abnormal findings: Secondary | ICD-10-CM | POA: Diagnosis not present

## 2022-06-06 ENCOUNTER — Ambulatory Visit (HOSPITAL_COMMUNITY)
Admission: RE | Admit: 2022-06-06 | Discharge: 2022-06-06 | Disposition: A | Discharge status: Home / Self Care | Payer: Federal, State, Local not specified - PPO | Attending: Urology | Admitting: Urology

## 2022-06-06 ENCOUNTER — Ambulatory Visit (HOSPITAL_COMMUNITY): Payer: Federal, State, Local not specified - PPO | Admitting: Physician Assistant

## 2022-06-06 ENCOUNTER — Encounter (HOSPITAL_COMMUNITY)
Admission: RE | Disposition: A | Discharge status: Home / Self Care | Payer: Self-pay | Source: Home / Self Care | Attending: Urology

## 2022-06-06 ENCOUNTER — Ambulatory Visit (HOSPITAL_COMMUNITY): Payer: Federal, State, Local not specified - PPO | Admitting: Anesthesiology

## 2022-06-06 ENCOUNTER — Other Ambulatory Visit: Payer: Self-pay

## 2022-06-06 ENCOUNTER — Encounter (HOSPITAL_COMMUNITY): Payer: Self-pay | Admitting: Urology

## 2022-06-06 ENCOUNTER — Ambulatory Visit (HOSPITAL_COMMUNITY): Payer: Federal, State, Local not specified - PPO

## 2022-06-06 DIAGNOSIS — Z01818 Encounter for other preprocedural examination: Secondary | ICD-10-CM

## 2022-06-06 DIAGNOSIS — M1990 Unspecified osteoarthritis, unspecified site: Secondary | ICD-10-CM | POA: Diagnosis not present

## 2022-06-06 DIAGNOSIS — K7460 Unspecified cirrhosis of liver: Secondary | ICD-10-CM | POA: Insufficient documentation

## 2022-06-06 DIAGNOSIS — K219 Gastro-esophageal reflux disease without esophagitis: Secondary | ICD-10-CM | POA: Diagnosis not present

## 2022-06-06 DIAGNOSIS — G709 Myoneural disorder, unspecified: Secondary | ICD-10-CM | POA: Diagnosis not present

## 2022-06-06 DIAGNOSIS — Z466 Encounter for fitting and adjustment of urinary device: Secondary | ICD-10-CM | POA: Diagnosis not present

## 2022-06-06 DIAGNOSIS — I10 Essential (primary) hypertension: Secondary | ICD-10-CM | POA: Insufficient documentation

## 2022-06-06 DIAGNOSIS — Z87442 Personal history of urinary calculi: Secondary | ICD-10-CM | POA: Diagnosis not present

## 2022-06-06 DIAGNOSIS — D8689 Sarcoidosis of other sites: Secondary | ICD-10-CM | POA: Insufficient documentation

## 2022-06-06 DIAGNOSIS — F419 Anxiety disorder, unspecified: Secondary | ICD-10-CM | POA: Insufficient documentation

## 2022-06-06 DIAGNOSIS — N201 Calculus of ureter: Secondary | ICD-10-CM | POA: Diagnosis not present

## 2022-06-06 HISTORY — PX: CYSTOSCOPY WITH RETROGRADE PYELOGRAM, URETEROSCOPY AND STENT PLACEMENT: SHX5789

## 2022-06-06 HISTORY — PX: HOLMIUM LASER APPLICATION: SHX5852

## 2022-06-06 SURGERY — CYSTOURETEROSCOPY, WITH RETROGRADE PYELOGRAM AND STENT INSERTION
Anesthesia: General | Laterality: Left

## 2022-06-06 MED ORDER — OXYCODONE HCL 5 MG PO TABS: 5.0000 mg | ORAL_TABLET | Freq: Once | ORAL | Status: DC | PRN

## 2022-06-06 MED ORDER — CEFAZOLIN SODIUM-DEXTROSE 2-4 GM/100ML-% IV SOLN
2.0000 g | INTRAVENOUS | Status: AC
  Administered 2022-06-06: 2 g via INTRAVENOUS
  Filled 2022-06-06: qty 100

## 2022-06-06 MED ORDER — SODIUM CHLORIDE 0.9 % IR SOLN
Status: DC | PRN
  Administered 2022-06-06: 3000 mL

## 2022-06-06 MED ORDER — OXYCODONE HCL 5 MG/5ML PO SOLN: 5.0000 mg | Freq: Once | ORAL | Status: DC | PRN

## 2022-06-06 MED ORDER — DEXAMETHASONE SODIUM PHOSPHATE 10 MG/ML IJ SOLN
INTRAMUSCULAR | Status: DC | PRN
  Administered 2022-06-06: 10 mg via INTRAVENOUS

## 2022-06-06 MED ORDER — PROPOFOL 500 MG/50ML IV EMUL
INTRAVENOUS | Status: DC | PRN
  Administered 2022-06-06: 150 ug/kg/min via INTRAVENOUS

## 2022-06-06 MED ORDER — LIDOCAINE 2% (20 MG/ML) 5 ML SYRINGE
INTRAMUSCULAR | Status: DC | PRN
  Administered 2022-06-06: 100 mg via INTRAVENOUS

## 2022-06-06 MED ORDER — FENTANYL CITRATE PF 50 MCG/ML IJ SOSY: 25.0000 ug | PREFILLED_SYRINGE | INTRAMUSCULAR | Status: DC | PRN

## 2022-06-06 MED ORDER — TAMSULOSIN HCL 0.4 MG PO CAPS: 0.4000 mg | ORAL_CAPSULE | Freq: Every day | ORAL | 0 refills | Status: DC

## 2022-06-06 MED ORDER — PROPOFOL 1000 MG/100ML IV EMUL
INTRAVENOUS | Status: AC
  Filled 2022-06-06: qty 100

## 2022-06-06 MED ORDER — DEXAMETHASONE SODIUM PHOSPHATE 10 MG/ML IJ SOLN
INTRAMUSCULAR | Status: AC
  Filled 2022-06-06: qty 1

## 2022-06-06 MED ORDER — IOHEXOL 300 MG/ML  SOLN
INTRAMUSCULAR | Status: DC | PRN
  Administered 2022-06-06: 10 mL

## 2022-06-06 MED ORDER — ONDANSETRON HCL 4 MG/2ML IJ SOLN: 4.0000 mg | Freq: Four times a day (QID) | INTRAMUSCULAR | Status: DC | PRN

## 2022-06-06 MED ORDER — ONDANSETRON HCL 4 MG/2ML IJ SOLN
INTRAMUSCULAR | Status: AC
  Filled 2022-06-06: qty 2

## 2022-06-06 MED ORDER — PROPOFOL 10 MG/ML IV BOLUS
INTRAVENOUS | Status: DC | PRN
  Administered 2022-06-06: 180 mg via INTRAVENOUS

## 2022-06-06 MED ORDER — ORAL CARE MOUTH RINSE: 15.0000 mL | Freq: Once | OROMUCOSAL | Status: AC

## 2022-06-06 MED ORDER — MIDAZOLAM HCL 2 MG/2ML IJ SOLN
INTRAMUSCULAR | Status: AC
  Filled 2022-06-06: qty 2

## 2022-06-06 MED ORDER — FENTANYL CITRATE (PF) 100 MCG/2ML IJ SOLN
INTRAMUSCULAR | Status: DC | PRN
  Administered 2022-06-06: 100 ug via INTRAVENOUS

## 2022-06-06 MED ORDER — ONDANSETRON HCL 4 MG/2ML IJ SOLN
INTRAMUSCULAR | Status: DC | PRN
  Administered 2022-06-06: 4 mg via INTRAVENOUS

## 2022-06-06 MED ORDER — LACTATED RINGERS IV SOLN: INTRAVENOUS | Status: DC

## 2022-06-06 MED ORDER — DOXYCYCLINE MONOHYDRATE 100 MG PO TABS: 100.0000 mg | ORAL_TABLET | Freq: Once | ORAL | 0 refills | Status: AC

## 2022-06-06 MED ORDER — FENTANYL CITRATE (PF) 100 MCG/2ML IJ SOLN
INTRAMUSCULAR | Status: AC
  Filled 2022-06-06: qty 2

## 2022-06-06 MED ORDER — MIDAZOLAM HCL 5 MG/5ML IJ SOLN
INTRAMUSCULAR | Status: DC | PRN
  Administered 2022-06-06: 2 mg via INTRAVENOUS

## 2022-06-06 MED ORDER — LIDOCAINE HCL (PF) 2 % IJ SOLN
INTRAMUSCULAR | Status: AC
  Filled 2022-06-06: qty 5

## 2022-06-06 MED ORDER — CHLORHEXIDINE GLUCONATE 0.12 % MT SOLN
15.0000 mL | Freq: Once | OROMUCOSAL | Status: AC
  Administered 2022-06-06: 15 mL via OROMUCOSAL

## 2022-06-06 SURGICAL SUPPLY — 24 items
BAG URO CATCHER STRL LF (MISCELLANEOUS) ×1 IMPLANT
BASKET ZERO TIP NITINOL 2.4FR (BASKET) IMPLANT
BSKT STON RTRVL ZERO TP 2.4FR (BASKET)
CATH URETL OPEN 5X70 (CATHETERS) ×1 IMPLANT
CLOTH BEACON ORANGE TIMEOUT ST (SAFETY) ×1 IMPLANT
DRSG TEGADERM 2-3/8X2-3/4 SM (GAUZE/BANDAGES/DRESSINGS) IMPLANT
EXTRACTOR STONE 1.7FRX115CM (UROLOGICAL SUPPLIES) IMPLANT
FIBER LASER MOSES 200 DFL (Laser) IMPLANT
FIBER LASER MOSES 365 DFL (Laser) IMPLANT
GLOVE BIO SURGEON STRL SZ 6.5 (GLOVE) ×1 IMPLANT
GOWN STRL REUS W/ TWL LRG LVL3 (GOWN DISPOSABLE) ×1 IMPLANT
GOWN STRL REUS W/TWL LRG LVL3 (GOWN DISPOSABLE) ×1
GUIDEWIRE STR DUAL SENSOR (WIRE) ×1 IMPLANT
KIT TURNOVER KIT A (KITS) IMPLANT
LASER FIB FLEXIVA PULSE ID 365 (Laser) IMPLANT
MANIFOLD NEPTUNE II (INSTRUMENTS) ×1 IMPLANT
PACK CYSTO (CUSTOM PROCEDURE TRAY) ×1 IMPLANT
SHEATH NAVIGATOR HD 11/13X28 (SHEATH) IMPLANT
SHEATH NAVIGATOR HD 11/13X36 (SHEATH) IMPLANT
STENT URET 6FRX26 CONTOUR (STENTS) IMPLANT
TRACTIP FLEXIVA PULS ID 200XHI (Laser) IMPLANT
TRACTIP FLEXIVA PULSE ID 200 (Laser)
TUBING CONNECTING 10 (TUBING) ×1 IMPLANT
TUBING UROLOGY SET (TUBING) ×1 IMPLANT

## 2022-06-06 NOTE — Transfer of Care (Signed)
Immediate Anesthesia Transfer of Care Note  Patient: Yvonne Chapman  Procedure(s) Performed: CYSTOSCOPY WITH RETROGRADE PYELOGRAM, DIAGNOSTIC URETEROSCOPY AND STENT PLACEMENT (Left) HOLMIUM LASER APPLICATION (Left)  Patient Location: PACU  Anesthesia Type:General  Level of Consciousness: sedated  Airway & Oxygen Therapy: Patient Spontanous Breathing and Patient connected to nasal cannula oxygen  Post-op Assessment: Report given to RN and Post -op Vital signs reviewed and stable  Post vital signs: Reviewed and stable  Last Vitals:  Vitals Value Taken Time  BP    Temp    Pulse    Resp    SpO2      Last Pain:  Vitals:   06/06/22 0740  TempSrc:   PainSc: 0-No pain      Patients Stated Pain Goal: 3 (03/00/92 3300)  Complications: No notable events documented.

## 2022-06-06 NOTE — Op Note (Signed)
Preoperative diagnosis: left ureteral calculus  Postoperative diagnosis: No calculus seen  Procedure:  Cystoscopy Diagnostic left ureteroscopy left 66F x 26 cm ureteral stent placement - with tether left retrograde pyelography with interpretation  Surgeon: Jacalyn Lefevre, MD  Anesthesia: General  Complications: None  Intraoperative findings:  Normal urethra Bilateral orthotropic ureteral orifices left retrograde pyelography demonstrated normal left ureter and renal pelvis without any filling defects noted in the kidney or ureter.  No hydronephrosis noted. Bladder mucosa normal without masses   EBL: Minimal  Specimens: None  Disposition of specimens: [facility] for stone analysis  Indication: Yvonne Chapman is a 62 y.o.   patient with a 2 mm left UVJ calculus and associated symptoms. After reviewing the management options for treatment, the patient elected to proceed with the above surgical procedure(s). We have discussed the potential benefits and risks of the procedure, side effects of the proposed treatment, the likelihood of the patient achieving the goals of the procedure, and any potential problems that might occur during the procedure or recuperation. Informed consent has been obtained.   Description of procedure:  The patient was taken to the operating room and general anesthesia was induced.  The patient was placed in the dorsal lithotomy position, prepped and draped in the usual sterile fashion, and preoperative antibiotics were administered. A preoperative time-out was performed.   Cystourethroscopy was performed.  The patient's urethra was examined and was normal. The bladder was then systematically examined in its entirety. There was no evidence for any bladder tumors, stones, or other mucosal pathology.    Attention then turned to the left ureteral orifice and a ureteral catheter was used to intubate the ureteral orifice.  Omnipaque contrast was  injected through the ureteral catheter and a retrograde pyelogram was performed with findings as dictated above.  A 0.38 sensor guidewire was then advanced up the left ureter into the renal pelvis under fluoroscopic guidance. The 4.5 Fr semirigid ureteroscope was then advanced into the ureter to the proximal ureter and no stone was encountered.  The ureteroscope was slowly removed and reinspection of the ureter revealed no remaining visible stones.  The wire was then backloaded through the cystoscope and a ureteral stent was advance over the wire using Seldinger technique.  The stent was positioned appropriately under fluoroscopic and cystoscopic guidance.  The wire was then removed with an adequate stent curl noted in the renal pelvis as well as in the bladder.  The bladder was then emptied and the procedure ended.  The patient appeared to tolerate the procedure well and without complications.  The patient was able to be awakened and transferred to the recovery unit in satisfactory condition.   Disposition: The tether of the stent was left on and tucked inside the patient's vagina.  Instructions for removing the stent have been provided to the patient.

## 2022-06-06 NOTE — Anesthesia Postprocedure Evaluation (Signed)
Anesthesia Post Note  Patient: Yvonne Chapman  Procedure(s) Performed: CYSTOSCOPY WITH RETROGRADE PYELOGRAM, DIAGNOSTIC URETEROSCOPY AND STENT PLACEMENT (Left) HOLMIUM LASER APPLICATION (Left)     Patient location during evaluation: PACU Anesthesia Type: General Level of consciousness: awake and alert Pain management: pain level controlled Vital Signs Assessment: post-procedure vital signs reviewed and stable Respiratory status: spontaneous breathing, nonlabored ventilation, respiratory function stable and patient connected to nasal cannula oxygen Cardiovascular status: blood pressure returned to baseline and stable Postop Assessment: no apparent nausea or vomiting Anesthetic complications: no   No notable events documented.  Last Vitals:  Vitals:   06/06/22 0943 06/06/22 1000  BP: 130/77 (!) 125/91  Pulse: 73 79  Resp: 11   Temp:    SpO2: 99% 100%    Last Pain:  Vitals:   06/06/22 1000  TempSrc:   PainSc: 0-No pain                 Remmi Armenteros S

## 2022-06-06 NOTE — Anesthesia Preprocedure Evaluation (Signed)
Anesthesia Evaluation  Patient identified by MRN, date of birth, ID band Patient awake    Reviewed: Allergy & Precautions, H&P , NPO status , Patient's Chart, lab work & pertinent test results  History of Anesthesia Complications (+) PONV and history of anesthetic complications  Airway Mallampati: II   Neck ROM: full    Dental   Pulmonary neg pulmonary ROS   breath sounds clear to auscultation       Cardiovascular hypertension,  Rhythm:regular Rate:Normal     Neuro/Psych  Headaches PSYCHIATRIC DISORDERS Anxiety      Neuromuscular disease    GI/Hepatic ,GERD  ,,(+) Cirrhosis       , Hepatitis -S/p TIPS  Sarcoidosis affecting liver.   Endo/Other    Renal/GU stones     Musculoskeletal  (+) Arthritis ,    Abdominal   Peds  Hematology   Anesthesia Other Findings   Reproductive/Obstetrics                             Anesthesia Physical Anesthesia Plan  ASA: 3  Anesthesia Plan: General   Post-op Pain Management:    Induction: Intravenous  PONV Risk Score and Plan: 4 or greater and Ondansetron, Dexamethasone, Midazolam and Treatment may vary due to age or medical condition  Airway Management Planned: LMA  Additional Equipment:   Intra-op Plan:   Post-operative Plan: Extubation in OR  Informed Consent: I have reviewed the patients History and Physical, chart, labs and discussed the procedure including the risks, benefits and alternatives for the proposed anesthesia with the patient or authorized representative who has indicated his/her understanding and acceptance.     Dental advisory given  Plan Discussed with: CRNA, Anesthesiologist and Surgeon  Anesthesia Plan Comments:        Anesthesia Quick Evaluation

## 2022-06-06 NOTE — Discharge Instructions (Signed)
DISCHARGE INSTRUCTIONS FOR KIDNEY STONE/URETERAL STENT   MEDICATIONS:  1. Resume all your other meds from home  2. AZO over the counter can help with the burning/stinging when you urinate. 3. Tramadol is for moderate/severe pain, otherwise taking up to 1000 mg every 6 hours of plain Tylenol will help treat your pain.   4. Take Cephalexin one hour prior to removal of your stent.  5. Tamsulosin can help with stent discomfort.   ACTIVITY:  1. No strenuous activity x 1week  2. No driving while on narcotic pain medications  3. Drink plenty of water  4. Continue to walk at home - you can still get blood clots when you are at home, so keep active, but don't over do it.  5. May return to work/school tomorrow or when you feel ready   BATHING:  1. You can shower and we recommend daily showers  2. You have a string coming from your urethra: The stent string is attached to your ureteral stent. Do not pull on this.   SIGNS/SYMPTOMS TO CALL:  Please call us if you have a fever greater than 101.5, uncontrolled nausea/vomiting, uncontrolled pain, dizziness, unable to urinate, bloody urine, chest pain, shortness of breath, leg swelling, leg pain, redness around wound, drainage from wound, or any other concerns or questions.   You can reach [place] at [phone].   FOLLOW-UP:  1. You have a string attached to your stent, you may remove it on Friday, February 9 in the morning. To do this, pull the string until the stent is completely removed. You may feel an odd sensation in your back. If you are too nervous to do this, it will be removed at your follow up on 2/13.

## 2022-06-06 NOTE — Interval H&P Note (Signed)
History and Physical Interval Note: I reviewed patient's imaging.  Please note that the retained stone is at the left UVJ.  I confirmed the left side with the patient and marked her left hand.  06/06/2022 8:24 AM  Yvonne Chapman  has presented today for surgery, with the diagnosis of [place].  The various methods of treatment have been discussed with the patient and family. After consideration of risks, benefits and other options for treatment, the patient has consented to  Procedure(s) with comments: CYSTOSCOPY WITH RETROGRADE PYELOGRAM, URETEROSCOPY AND STENT PLACEMENT (Left) - 1 HR HOLMIUM LASER APPLICATION (Left) as a surgical intervention.  The patient's history has been reviewed, patient examined, no change in status, stable for surgery.  I have reviewed the patient's chart and labs.  Questions were answered to the patient's satisfaction.     Reilly Molchan D Aunesti Pellegrino

## 2022-06-06 NOTE — Anesthesia Procedure Notes (Signed)
Procedure Name: LMA Insertion Date/Time: 06/06/2022 8:44 AM  Performed by: Lind Covert, CRNAPre-anesthesia Checklist: Patient identified, Emergency Drugs available, Suction available, Patient being monitored and Timeout performed Patient Re-evaluated:Patient Re-evaluated prior to induction Oxygen Delivery Method: Circle system utilized Preoxygenation: Pre-oxygenation with 100% oxygen Induction Type: IV induction LMA: LMA inserted LMA Size: 4.0 Tube type: Oral Number of attempts: 1 Placement Confirmation: positive ETCO2 and breath sounds checked- equal and bilateral Tube secured with: Tape Dental Injury: Teeth and Oropharynx as per pre-operative assessment

## 2022-06-07 ENCOUNTER — Encounter (HOSPITAL_COMMUNITY): Payer: Self-pay | Admitting: Urology

## 2022-06-13 DIAGNOSIS — B952 Enterococcus as the cause of diseases classified elsewhere: Secondary | ICD-10-CM | POA: Diagnosis not present

## 2022-06-13 DIAGNOSIS — N201 Calculus of ureter: Secondary | ICD-10-CM | POA: Diagnosis not present

## 2022-06-13 DIAGNOSIS — N390 Urinary tract infection, site not specified: Secondary | ICD-10-CM | POA: Diagnosis not present

## 2022-06-19 DIAGNOSIS — Z780 Asymptomatic menopausal state: Secondary | ICD-10-CM | POA: Diagnosis not present

## 2022-06-19 DIAGNOSIS — I10 Essential (primary) hypertension: Secondary | ICD-10-CM | POA: Diagnosis not present

## 2022-06-19 DIAGNOSIS — K7460 Unspecified cirrhosis of liver: Secondary | ICD-10-CM | POA: Diagnosis not present

## 2022-06-21 DIAGNOSIS — K7469 Other cirrhosis of liver: Secondary | ICD-10-CM | POA: Diagnosis not present

## 2022-06-21 DIAGNOSIS — Z95828 Presence of other vascular implants and grafts: Secondary | ICD-10-CM | POA: Diagnosis not present

## 2022-06-21 DIAGNOSIS — I728 Aneurysm of other specified arteries: Secondary | ICD-10-CM | POA: Diagnosis not present

## 2022-07-18 DIAGNOSIS — B952 Enterococcus as the cause of diseases classified elsewhere: Secondary | ICD-10-CM | POA: Diagnosis not present

## 2022-07-18 DIAGNOSIS — N390 Urinary tract infection, site not specified: Secondary | ICD-10-CM | POA: Diagnosis not present

## 2022-07-18 DIAGNOSIS — N201 Calculus of ureter: Secondary | ICD-10-CM | POA: Diagnosis not present

## 2022-08-01 DIAGNOSIS — R8271 Bacteriuria: Secondary | ICD-10-CM | POA: Diagnosis not present

## 2022-08-02 DIAGNOSIS — K7460 Unspecified cirrhosis of liver: Secondary | ICD-10-CM | POA: Diagnosis not present

## 2022-08-02 DIAGNOSIS — I10 Essential (primary) hypertension: Secondary | ICD-10-CM | POA: Diagnosis not present

## 2022-08-07 DIAGNOSIS — M79602 Pain in left arm: Secondary | ICD-10-CM | POA: Diagnosis not present

## 2022-08-07 DIAGNOSIS — R5383 Other fatigue: Secondary | ICD-10-CM | POA: Diagnosis not present

## 2022-08-07 DIAGNOSIS — R0789 Other chest pain: Secondary | ICD-10-CM | POA: Diagnosis not present

## 2022-08-07 DIAGNOSIS — I10 Essential (primary) hypertension: Secondary | ICD-10-CM | POA: Diagnosis not present

## 2022-09-04 DIAGNOSIS — I10 Essential (primary) hypertension: Secondary | ICD-10-CM | POA: Diagnosis not present

## 2022-09-04 DIAGNOSIS — Z0000 Encounter for general adult medical examination without abnormal findings: Secondary | ICD-10-CM | POA: Diagnosis not present

## 2022-09-04 DIAGNOSIS — E7800 Pure hypercholesterolemia, unspecified: Secondary | ICD-10-CM | POA: Diagnosis not present

## 2022-09-09 ENCOUNTER — Ambulatory Visit
Admission: EM | Admit: 2022-09-09 | Discharge: 2022-09-09 | Disposition: A | Discharge status: Home / Self Care | Payer: Federal, State, Local not specified - PPO

## 2022-09-09 DIAGNOSIS — J019 Acute sinusitis, unspecified: Secondary | ICD-10-CM | POA: Diagnosis not present

## 2022-09-09 DIAGNOSIS — L0231 Cutaneous abscess of buttock: Secondary | ICD-10-CM

## 2022-09-09 DIAGNOSIS — B9789 Other viral agents as the cause of diseases classified elsewhere: Secondary | ICD-10-CM | POA: Diagnosis not present

## 2022-09-09 MED ORDER — DOXYCYCLINE HYCLATE 100 MG PO CAPS: 100.0000 mg | ORAL_CAPSULE | Freq: Two times a day (BID) | ORAL | 0 refills | Status: DC

## 2022-09-09 MED ORDER — BENZONATATE 100 MG PO CAPS: 100.0000 mg | ORAL_CAPSULE | Freq: Every evening | ORAL | 0 refills | Status: DC | PRN

## 2022-09-09 MED ORDER — PSEUDOEPHEDRINE HCL 60 MG PO TABS: 60.0000 mg | ORAL_TABLET | Freq: Three times a day (TID) | ORAL | 0 refills | Status: DC | PRN

## 2022-09-09 NOTE — ED Triage Notes (Signed)
Pt reports cough, nasal congestion x 3 days; bilateral ear pain x 2 days. Allegra gives no relief.  Pt reports a hard bump in the left buttock x 2 days.   Reports she had her physical on 09/04/2022.

## 2022-09-09 NOTE — ED Provider Notes (Signed)
[facility]  Note:  This document was prepared using Conservation officer, historic buildings and may include unintentional dictation errors.  MRN: [id number] DOB: [birth year]  Subjective:   Yvonne Chapman is a 62 y.o. female presenting for 3 day history of sinus congestion, dry cough, bilateral ear pain.  No fever, chest pain, shob, wheezing, ear drainage, throat pain, painful swallowing. No smoking. No asthma. Has allergies, takes Allegra daily. Had an annual physical exam 09/04/2022.  Has also developed a bump of the left buttock the last 2 days. No drainage of pus or bleeding.   No current facility-administered medications for this encounter.  Current Outpatient Medications:    fexofenadine (ALLEGRA) 180 MG tablet, Take 180 mg by mouth daily., Disp: , Rfl:    amLODipine (NORVASC) 2.5 MG tablet, Take 2.5 mg by mouth at bedtime., Disp: , Rfl:    Cholecalciferol (VITAMIN D PO), Take 1 tablet by mouth daily., Disp: , Rfl:    Diclofenac Sodium CR 100 MG 24 hr tablet, Take 1 tablet (100 mg total) by mouth daily. (Patient not taking: Reported on 05/25/2022), Disp: 10 tablet, Rfl: 0   gentamicin cream (GARAMYCIN) 0.1 %, Apply 1 application topically 2 (two) times daily. (Patient not taking: Reported on 05/25/2022), Disp: 30 g, Rfl: 1   lactulose (CHRONULAC) 10 GM/15ML solution, Take 10 g by mouth 2 (two) times daily., Disp: , Rfl:    ondansetron (ZOFRAN ODT) 8 MG disintegrating tablet, 8mg  ODT q8 hours prn nausea, Disp: 4 tablet, Rfl: 0   oxyCODONE (ROXICODONE) 5 MG immediate release tablet, Take 1 tablet (5 mg total) by mouth every 6 (six) hours as needed for severe pain. (Patient not taking: Reported on 05/25/2022), Disp: 10 tablet, Rfl: 0   tamsulosin (FLOMAX) 0.4 MG CAPS capsule, Take 1 capsule (0.4 mg total) by mouth daily. (Patient not taking: Reported on 05/25/2022), Disp: 10 capsule, Rfl: 0   tamsulosin (FLOMAX) 0.4 MG CAPS capsule, Take 1 capsule (0.4 mg total) by mouth at  bedtime., Disp: 15 capsule, Rfl: 0   traMADol (ULTRAM) 50 MG tablet, Take 1 tablet (50 mg total) by mouth every 6 (six) hours as needed for severe pain (cannot drive or drink alcohol with this medication). (Patient not taking: Reported on 05/25/2022), Disp: 7 tablet, Rfl: 0   Allergies  Allergen Reactions   Amoxicillin Other (See Comments)    Pt stated most if her rxns are nausea    Cefuroxime Other (See Comments)   Cyclobenzaprine Other (See Comments)   Diphenhydramine Other (See Comments)   Elemental Sulfur Nausea Only   Floxin [Ofloxacin]    Metronidazole Nausea Only   Promethazine Hcl Other (See Comments)   Sulfamethoxazole Other (See Comments)    Past Medical History:  Diagnosis Date   Abnormal uterine bleeding    Anemia    Arthritis    Back pain    Bacterial vaginosis    Dysmenorrhea    Endometriosis    Fatty liver    Fibroids    GERD (gastroesophageal reflux disease)    Gonorrhea    Heart murmur    slight   Herpes    History of chicken pox    History of kidney stones    Hypertension    Lactose intolerance    Liver disorder    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   Monilia infection    Ovarian cyst    PONV (postoperative nausea and vomiting)    Sarcoidosis      Past Surgical  History:  Procedure Laterality Date   ABDOMINAL HYSTERECTOMY     CYSTOSCOPY WITH RETROGRADE PYELOGRAM, URETEROSCOPY AND STENT PLACEMENT Left 06/06/2022   Procedure: CYSTOSCOPY WITH RETROGRADE PYELOGRAM, DIAGNOSTIC URETEROSCOPY AND STENT PLACEMENT;  Surgeon: Noel Christmas, MD;  Location: [facility];  Service: Urology;  Laterality: Left;  1 HR   DILATION AND CURETTAGE OF UTERUS     FOOT SURGERY     HOLMIUM LASER APPLICATION Left 06/06/2022   Procedure: HOLMIUM LASER APPLICATION;  Surgeon: Noel Christmas, MD;  Location: [facility];  Service: Urology;  Laterality: Left;   LAPAROSCOPIC OVARIAN CYSTECTOMY     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     10 years ago   WISDOM TOOTH EXTRACTION      Family History  Problem Relation Age of  Onset   Hypertension Mother    Hyperlipidemia Mother    Hypertension Father    Diabetes Father    Hyperlipidemia Father    Cancer Father     Social History   Tobacco Use   Smoking status: Never   Smokeless tobacco: Never  Vaping Use   Vaping Use: Never used  Substance Use Topics   Alcohol use: No   Drug use: No    ROS   Objective:   Vitals: BP (!) 142/69 (BP Location: Left Arm)   Pulse 68   Temp 98.4 F (36.9 C) (Oral)   Resp 18   SpO2 99%   Physical Exam Constitutional:      General: She is not in acute distress.    Appearance: Normal appearance. She is well-developed and normal weight. She is not ill-appearing, toxic-appearing or diaphoretic.  HENT:     Head: Normocephalic and atraumatic.     Right Ear: Tympanic membrane, ear canal and external ear normal. No drainage or tenderness. No middle ear effusion. There is no impacted cerumen. Tympanic membrane is not erythematous or bulging.     Left Ear: Tympanic membrane, ear canal and external ear normal. No drainage or tenderness.  No middle ear effusion. There is no impacted cerumen. Tympanic membrane is not erythematous or bulging.     Nose: Nose normal. No congestion or rhinorrhea.     Mouth/Throat:     Mouth: Mucous membranes are moist. No oral lesions.     Pharynx: No pharyngeal swelling, oropharyngeal exudate, posterior oropharyngeal erythema or uvula swelling.     Tonsils: No tonsillar exudate or tonsillar abscesses.  Eyes:     General: No scleral icterus.       Right eye: No discharge.        Left eye: No discharge.     Extraocular Movements: Extraocular movements intact.     Right eye: Normal extraocular motion.     Left eye: Normal extraocular motion.     Conjunctiva/sclera: Conjunctivae normal.  Cardiovascular:     Rate and Rhythm: Normal rate and regular rhythm.     Heart sounds: Normal heart sounds. No murmur heard.    No friction rub. No gallop.  Pulmonary:     Effort: Pulmonary effort is  normal. No respiratory distress.     Breath sounds: No stridor. No wheezing, rhonchi or rales.  Chest:     Chest wall: No tenderness.  Musculoskeletal:     Cervical back: Normal range of motion and neck supple.  Lymphadenopathy:     Cervical: No cervical adenopathy.  Skin:    General: Skin is warm and dry.       Neurological:     General:  No focal deficit present.     Mental Status: She is alert and oriented to person, place, and time.  Psychiatric:        Mood and Affect: Mood normal.        Behavior: Behavior normal.     Assessment and Plan :   PDMP not reviewed this encounter.  1. Left buttock abscess   2. Acute viral sinusitis    Recommended starting doxycycline for her resolving buttock abscess.  Area is not amenable to drainage.  Use supportive care for acute viral sinusitis. Deferred imaging given clear cardiopulmonary exam, hemodynamically stable vital signs. Counseled patient on potential for adverse effects with medications prescribed/recommended today, ER and return-to-clinic precautions discussed, patient verbalized understanding.    Wallis Bamberg, PA-C 09/09/22 1459

## 2022-09-09 NOTE — Discharge Instructions (Signed)
Use warm compresses to the buttock area 3 times daily, 5-10 minutes at a time. Start doxycycline for the infection.    Keep taking Allegra, add Sudafed. Use cough medication at bedtime.

## 2022-11-06 DIAGNOSIS — Z1231 Encounter for screening mammogram for malignant neoplasm of breast: Secondary | ICD-10-CM | POA: Diagnosis not present

## 2022-12-03 ENCOUNTER — Ambulatory Visit
Admission: EM | Admit: 2022-12-03 | Discharge: 2022-12-03 | Disposition: A | Discharge status: Home / Self Care | Payer: Federal, State, Local not specified - PPO | Attending: Urgent Care | Admitting: Urgent Care

## 2022-12-03 ENCOUNTER — Ambulatory Visit (INDEPENDENT_AMBULATORY_CARE_PROVIDER_SITE_OTHER): Payer: Federal, State, Local not specified - PPO

## 2022-12-03 DIAGNOSIS — E876 Hypokalemia: Secondary | ICD-10-CM | POA: Diagnosis not present

## 2022-12-03 DIAGNOSIS — R109 Unspecified abdominal pain: Secondary | ICD-10-CM

## 2022-12-03 DIAGNOSIS — Z87442 Personal history of urinary calculi: Secondary | ICD-10-CM

## 2022-12-03 DIAGNOSIS — R252 Cramp and spasm: Secondary | ICD-10-CM | POA: Insufficient documentation

## 2022-12-03 DIAGNOSIS — R519 Headache, unspecified: Secondary | ICD-10-CM | POA: Insufficient documentation

## 2022-12-03 DIAGNOSIS — Z1152 Encounter for screening for COVID-19: Secondary | ICD-10-CM | POA: Diagnosis not present

## 2022-12-03 DIAGNOSIS — R350 Frequency of micturition: Secondary | ICD-10-CM | POA: Diagnosis not present

## 2022-12-03 LAB — POCT URINALYSIS DIP (MANUAL ENTRY)
Bilirubin, UA: NEGATIVE
Blood, UA: NEGATIVE
Glucose, UA: NEGATIVE mg/dL
Ketones, POC UA: NEGATIVE mg/dL
Nitrite, UA: NEGATIVE
Protein Ur, POC: NEGATIVE mg/dL
Spec Grav, UA: 1.02 (ref 1.010–1.025)
Urobilinogen, UA: 1 E.U./dL
pH, UA: 7.5 (ref 5.0–8.0)

## 2022-12-03 MED ORDER — ROPINIROLE HCL 0.25 MG PO TABS: 0.2500 mg | ORAL_TABLET | Freq: Every day | ORAL | 0 refills | Status: AC

## 2022-12-03 NOTE — Discharge Instructions (Signed)
Your abdominal x-ray does not show evidence of a kidney stone. Your urine does not show an infection, but we will send out your urine culture for confirmation of this. Your COVID swab is pending. Please have your primary care physician draw lab work tomorrow to assess the cause of your cramps.  I would strongly encourage a CMP, CBC, and iron level. I have prescribed you a medication to try this evening to see if you are developing restless leg syndrome.

## 2022-12-03 NOTE — ED Provider Notes (Signed)
[facility]    CSN: [id number] Arrival date & time: 12/03/22  0810      History   Chief Complaint Chief Complaint  Patient presents with   Flank Pain    HPI CAMERA Yvonne Chapman is a 62 y.o. female.   Pleasant 62yo female presents today with several complaints. Pt states she has had L sided flank pain for the past one week. She also noted increased urinary frequency. She does have a hx of kidney stones in the past, thought to be related to her tea intake. She had a L sided ureteral stent placed in Feb this year. Pt states the stone passed and she has not had any issues since. Denies dysuria, hematuria or hesitancy, just states frequency. Pt also is concerned about leg cramps primarily in the R. States it has been bothering her primarily in the night and early morning, but resolves during the day. She has been waking up around 4am every day to urinate, and feels the cramping sensation at that time. Denies any tenderness to calf, swelling in lower leg, or hx of blood clots. States sx resolve during the day. Does have hx of anemia. Lastly, pt is concerned about a headache and congestion that has been bothering her for the past two days. 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 Denies fever or cough. States she works from home and has not been around anyone sick but is concerned about cause of the symptoms.    Flank Pain    Past Medical History:  Diagnosis Date   Abnormal uterine bleeding    Anemia    Arthritis    Back pain    Bacterial vaginosis    Dysmenorrhea    Endometriosis    Fatty liver    Fibroids    GERD (gastroesophageal reflux disease)    Gonorrhea    Heart murmur    slight   Herpes    History of chicken pox    History of kidney stones    Hypertension    Lactose intolerance    Liver disorder    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   Monilia infection    Ovarian cyst    PONV (postoperative nausea and vomiting)    Sarcoidosis     Patient Active Problem List   Diagnosis  Date Noted   Chest pain, precordial 09/02/2021   Essential hypertension 09/01/2021   Acute maxillary sinusitis 05/30/2021   Anxiety disorder 05/30/2021   Chronic hepatitis (HCC) 05/30/2021   Constipation 05/30/2021   Headache disorder 05/30/2021   Headache 05/30/2021   Heart murmur 05/30/2021   Hereditary and idiopathic neuropathy, unspecified 05/30/2021   Hyperlipidemia 05/30/2021   Insomnia 05/30/2021   Kidney stone 05/30/2021   Leukopenia 05/30/2021   Localized edema 05/30/2021   Lower urinary tract symptoms 05/30/2021   Other shoulder lesions, unspecified shoulder 05/30/2021   Pure hypercholesterolemia 05/30/2021   Splenic artery aneurysm (HCC) 05/30/2021   Vitamin D deficiency 05/30/2021   Low back pain 05/24/2020   Overweight 04/01/2012   Cirrhosis of liver (HCC) 03/27/2012   Portal hypertension (HCC) 03/27/2012   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   Sarcoidosis    Vaginitis    Dysmenorrhea    Fibroids    Bacterial vaginosis    Yeast infection    Trichomonas    Herpes    Monilia infection    Anemia    Ovarian cyst     Past Surgical History:  Procedure Laterality Date  ABDOMINAL HYSTERECTOMY     CYSTOSCOPY WITH RETROGRADE PYELOGRAM, URETEROSCOPY AND STENT PLACEMENT Left 06/06/2022   Procedure: CYSTOSCOPY WITH RETROGRADE PYELOGRAM, DIAGNOSTIC URETEROSCOPY AND STENT PLACEMENT;  Surgeon: Noel Christmas, MD;  Location: [facility];  Service: Urology;  Laterality: Left;  1 HR   DILATION AND CURETTAGE OF UTERUS     FOOT SURGERY     HOLMIUM LASER APPLICATION Left 06/06/2022   Procedure: HOLMIUM LASER APPLICATION;  Surgeon: Noel Christmas, MD;  Location: [facility];  Service: Urology;  Laterality: Left;   LAPAROSCOPIC OVARIAN CYSTECTOMY     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     10 years ago   WISDOM TOOTH EXTRACTION      OB History     Gravida  2   Para  0   Term      Preterm      AB      Living         SAB      IAB      Ectopic       Multiple      Live Births               Home Medications    Prior to Admission medications   Medication Sig Start Date End Date Taking? Authorizing Provider  rOPINIRole (REQUIP) 0.25 MG tablet Take 1 tablet (0.25 mg total) by mouth at bedtime. 12/03/22  Yes ,  L, PA  amLODipine (NORVASC) 2.5 MG tablet Take 2.5 mg by mouth at bedtime. 08/23/21   [provider]  Cholecalciferol (VITAMIN D PO) Take 1 tablet by mouth daily.    [provider]  fexofenadine (ALLEGRA) 180 MG tablet Take 180 mg by mouth daily.    [provider]    Family History Family History  Problem Relation Age of Onset   Hypertension Mother    Hyperlipidemia Mother    Hypertension Father    Diabetes Father    Hyperlipidemia Father    Cancer Father     Social History Social History   Tobacco Use   Smoking status: Never   Smokeless tobacco: Never  Vaping Use   Vaping status: Never Used  Substance Use Topics   Alcohol use: No   Drug use: No     Allergies   Amoxicillin, Cefuroxime, Cyclobenzaprine, Diphenhydramine, Elemental sulfur, Floxin [ofloxacin], Metronidazole, Promethazine hcl, and Sulfamethoxazole   Review of Systems Review of Systems  Genitourinary:  Positive for flank pain.  As per HPI   Physical Exam Triage Vital Signs ED Triage Vitals  Encounter Vitals Group     BP 12/03/22 0827 139/72     Systolic BP Percentile --      Diastolic BP Percentile --      Pulse Rate 12/03/22 0827 74     Resp 12/03/22 0827 20     Temp 12/03/22 0827 98.2 F (36.8 C)     Temp Source 12/03/22 0827 Oral     SpO2 12/03/22 0827 98 %     Weight --      Height --      Head Circumference --      Peak Flow --      Pain Score 12/03/22 0828 5     Pain Loc --      Pain Education --      Exclude from Growth Chart --    No data found.  Updated Vital Signs BP 139/72 (BP Location: Right Arm)   Pulse 74   Temp  98.2 F (36.8 C) (Oral)   Resp 20   SpO2 98%    Visual Acuity Right Eye Distance:   Left Eye Distance:   Bilateral Distance:    Right Eye Near:   Left Eye Near:    Bilateral Near:     Physical Exam Vitals and nursing note reviewed.  Constitutional:      General: She is not in acute distress.    Appearance: Normal appearance. She is not ill-appearing, toxic-appearing or diaphoretic.  HENT:     Head: Normocephalic and atraumatic.     Right Ear: Tympanic membrane, ear canal and external ear normal.     Left Ear: Tympanic membrane, ear canal and external ear normal.     Mouth/Throat:     Mouth: Mucous membranes are moist.     Pharynx: Oropharynx is clear. No oropharyngeal exudate or posterior oropharyngeal erythema.  Eyes:     General: No scleral icterus.       Right eye: No discharge.        Left eye: No discharge.     Extraocular Movements: Extraocular movements intact.     Pupils: Pupils are equal, round, and reactive to light.  Cardiovascular:     Rate and Rhythm: Normal rate.  Pulmonary:     Effort: Pulmonary effort is normal. No respiratory distress.     Breath sounds: Normal breath sounds. No stridor. No wheezing.  Abdominal:     General: Abdomen is flat. Bowel sounds are normal. There is no distension.     Palpations: Abdomen is soft. There is no mass.     Tenderness: There is no abdominal tenderness. There is left CVA tenderness. There is no right CVA tenderness, guarding or rebound.     Hernia: No hernia is present.  Musculoskeletal:        General: No swelling or tenderness.     Right lower leg: No edema.     Left lower leg: No edema.     Comments: Negative homan sign  Skin:    General: Skin is warm and dry.     Coloration: Skin is not jaundiced.     Findings: No bruising, erythema or rash.  Neurological:     General: No focal deficit present.     Mental Status: She is alert and oriented to person, place, and time.     Sensory: No sensory deficit.     Motor: No weakness.  Psychiatric:        Mood and  Affect: Mood normal.        Behavior: Behavior normal.      UC Treatments / Results  Labs (all labs ordered are listed, but only abnormal results are displayed) Labs Reviewed  POCT URINALYSIS DIP (MANUAL ENTRY) - Abnormal; Notable for the following components:      Result Value   Leukocytes, UA Small (1+) (*)    All other components within normal limits  URINE CULTURE  SARS CORONAVIRUS 2 (TAT 6-24 HRS)    EKG   Radiology DG Abdomen 1 View  Result Date: 12/03/2022 CLINICAL DATA:  Left flank pain. EXAM: ABDOMEN - 1 VIEW COMPARISON:  06/13/2022 FINDINGS: Unremarkable bowel gas pattern. Left internal ureteral stent seen previously has been removed in the interval. Phleboliths over the left hemipelvis are stable in appearance. Visualized bony anatomy unremarkable. IMPRESSION: Interval removal of left internal ureteral stent. No acute findings. Electronically Signed   By: Kennith Center M.D.   On: 12/03/2022 09:22    Procedures  Procedures (including critical care time)  Medications Ordered in UC Medications - No data to display  Initial Impression / Assessment and Plan / UC Course  I have reviewed the triage vital signs and the nursing notes.  Pertinent labs & imaging results that were available during my care of the patient were reviewed by me and considered in my medical decision making (see chart for details).     Urinary frequency - UA shows small leukocytes, otherwise negative for acute abnormality. Pt does drink extensive tea which has a diuretic component, although states her intake has not changed recently. She denies additional sx concerning for UTI. Will send out urine cx to assess for infection. L flank pain - VSS. No concern for pyelonephritis at this time. Urine cx obtained. No radiopaque stone noted in kidney or urinary tract.  Hx kidney stones Acute headache - concern for covid given sx presentation. Covid PCR obtained Cramp in lower leg - occurs only at night per  pt. She has hx of anemia. No s/sx for DVT on exam. Most likely consistent with RLS. Pt wanting to try tx. Will do 0.25mg  requip, has f/u with PCP in am. We had discussed drawing labs (CMP, Mg, CBC), however pt deferred and states she will have drawn with PCP in am. Hypokalemia - noted on labs intermittently dating back do 2015. Recommended pt eat a daily banana, will f/u with PCP for further recommendations   Final Clinical Impressions(s) / UC Diagnoses   Final diagnoses:  Urinary frequency  Acute left flank pain  History of kidney stones  Acute nonintractable headache, unspecified headache type  Cramp in lower leg  Hypokalemia     Discharge Instructions      Your abdominal x-ray does not show evidence of a kidney stone. Your urine does not show an infection, but we will send out your urine culture for confirmation of this. Your COVID swab is pending. Please have your primary care physician draw lab work tomorrow to assess the cause of your cramps.  I would strongly encourage a CMP, CBC, and iron level. I have prescribed you a medication to try this evening to see if you are developing restless leg syndrome.     ED Prescriptions     Medication Sig Dispense Auth. Provider   rOPINIRole (REQUIP) 0.25 MG tablet Take 1 tablet (0.25 mg total) by mouth at bedtime. 10 tablet ,  L, [place]      PDMP not reviewed this encounter.   Maretta Bees, [place] 12/03/22 1902

## 2022-12-03 NOTE — ED Triage Notes (Addendum)
Pt c/o left flank pain and increase UO x 1 week-also c/o RLE cramps x 3-4 days-NAD-steady gait-pt added she has appt with PCP tomorrow

## 2022-12-04 DIAGNOSIS — G4700 Insomnia, unspecified: Secondary | ICD-10-CM | POA: Diagnosis not present

## 2022-12-04 DIAGNOSIS — M79661 Pain in right lower leg: Secondary | ICD-10-CM | POA: Diagnosis not present

## 2022-12-11 DIAGNOSIS — Z95828 Presence of other vascular implants and grafts: Secondary | ICD-10-CM | POA: Diagnosis not present

## 2022-12-11 DIAGNOSIS — K769 Liver disease, unspecified: Secondary | ICD-10-CM | POA: Diagnosis not present

## 2022-12-11 DIAGNOSIS — E876 Hypokalemia: Secondary | ICD-10-CM | POA: Diagnosis not present

## 2022-12-11 DIAGNOSIS — D869 Sarcoidosis, unspecified: Secondary | ICD-10-CM | POA: Diagnosis not present

## 2022-12-11 DIAGNOSIS — K7469 Other cirrhosis of liver: Secondary | ICD-10-CM | POA: Diagnosis not present

## 2023-01-02 DIAGNOSIS — M79661 Pain in right lower leg: Secondary | ICD-10-CM | POA: Diagnosis not present

## 2023-02-25 NOTE — Progress Notes (Unsigned)
Cardiology Office Note:   Date:  02/28/2023  ID:  Yvonne Chapman, DOB 1960/07/25, MRN [id number] PCP: Noberto Retort, MD  [facility] Providers Cardiologist:  None {  History of Present Illness:   Yvonne Chapman is a 62 y.o. female who is following for evaluation of a murmur.  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 She did have an echo in 2014 which was unremarkable.    She goes to [facility] twice a year.  She has had no issues with her liver.  The patient denies any new symptoms such as chest discomfort, neck or arm discomfort. There has been no new shortness of breath, PND or orthopnea. There have been no reported palpitations, presyncope or syncope.    ROS: As stated in the HPI and negative for all other systems.  Studies Reviewed:    EKG:   EKG Interpretation Date/Time:  Wednesday February 28 2023 08:56:39 EDT Ventricular Rate:  70 PR Interval:  156 QRS Duration:  86 QT Interval:  408 QTC Calculation: 440 R Axis:   18  Text Interpretation: Normal sinus rhythm Minimal voltage criteria for LVH, may be normal variant ( R in aVL ) Nonspecific T wave abnormality When compared with ECG of 13-Jan-2014 19:12, No significant change was found Confirmed by Rollene Rotunda ([id number]) on 02/28/2023 9:15:33 AM    Risk Assessment/Calculations:              Physical Exam:   VS:  BP 124/86   Pulse 70   Ht 5\' 7"  (1.702 m)   Wt 234 lb 9.6 oz (106.4 kg)   SpO2 98%   BMI 36.74 kg/m    Wt Readings from Last 3 Encounters:  02/28/23 234 lb 9.6 oz (106.4 kg)  06/06/22 228 lb (103.4 kg)  05/30/22 228 lb (103.4 kg)     GEN: Well nourished, well developed in no acute distress NECK: No JVD; No carotid bruits CARDIAC: RRR, no murmurs, rubs, gallops RESPIRATORY:  Clear to auscultation without rales, wheezing or rhonchi  ABDOMEN: Soft, non-tender, non-distended EXTREMITIES:  No edema; No deformity   ASSESSMENT AND PLAN:   Chest pain: The  patient has had no further chest pain.  No change in therapy.   HTN:   Her BP is at target.  She will continue the meds as listed.  Dyslipidemia: Very conversation about this.  Her LDL is 134.  She prefer to try diet.  We gave her some recommendations on Mediterranean diet with a plant protein emphasis.  She can then get another lipid done by her primary provider in about 6 months.        Follow up with me in one year at her request.    Signed, Rollene Rotunda, MD

## 2023-02-28 ENCOUNTER — Ambulatory Visit: Payer: Federal, State, Local not specified - PPO | Attending: Cardiology | Admitting: Cardiology

## 2023-02-28 ENCOUNTER — Encounter: Payer: Self-pay | Admitting: Cardiology

## 2023-02-28 VITALS — BP 124/86 | HR 70 | Ht 67.0 in | Wt 234.6 lb

## 2023-02-28 DIAGNOSIS — R072 Precordial pain: Secondary | ICD-10-CM | POA: Diagnosis not present

## 2023-02-28 DIAGNOSIS — I10 Essential (primary) hypertension: Secondary | ICD-10-CM

## 2023-02-28 NOTE — Patient Instructions (Signed)
Medication Instructions:  Continue same medications *If you need a refill on your cardiac medications before your next appointment, please call your pharmacy*   Lab Work: Lipid panel with Primary MD in 6 months   Testing/Procedures: None ordered   Follow-Up: At [facility], you and your health needs are our priority.  As part of our continuing mission to provide you with exceptional heart care, we have created designated Provider Care Teams.  These Care Teams include your primary Cardiologist (physician) and Advanced Practice Providers (APPs -  Physician Assistants and Nurse Practitioners) who all work together to provide you with the care you need, when you need it.  We recommend signing up for the patient portal called "MyChart".  Sign up information is provided on this After Visit Summary.  MyChart is used to connect with patients for Virtual Visits (Telemedicine).  Patients are able to view lab/test results, encounter notes, upcoming appointments, etc.  Non-urgent messages can be sent to your provider as well.   To learn more about what you can do with MyChart, go to [email or web address].    Your next appointment:  1 year     Call in June to schedule Oct appointment     Provider:  Dr.Hochrein

## 2023-03-08 DIAGNOSIS — K7460 Unspecified cirrhosis of liver: Secondary | ICD-10-CM | POA: Diagnosis not present

## 2023-03-08 DIAGNOSIS — I10 Essential (primary) hypertension: Secondary | ICD-10-CM | POA: Diagnosis not present

## 2023-03-08 DIAGNOSIS — E7800 Pure hypercholesterolemia, unspecified: Secondary | ICD-10-CM | POA: Diagnosis not present

## 2023-03-08 DIAGNOSIS — I728 Aneurysm of other specified arteries: Secondary | ICD-10-CM | POA: Diagnosis not present

## 2023-03-08 DIAGNOSIS — E559 Vitamin D deficiency, unspecified: Secondary | ICD-10-CM | POA: Diagnosis not present

## 2023-04-09 DIAGNOSIS — R051 Acute cough: Secondary | ICD-10-CM | POA: Diagnosis not present

## 2023-04-09 DIAGNOSIS — J309 Allergic rhinitis, unspecified: Secondary | ICD-10-CM | POA: Diagnosis not present

## 2023-04-19 DIAGNOSIS — K080 Exfoliation of teeth due to systemic causes: Secondary | ICD-10-CM | POA: Diagnosis not present

## 2023-06-18 DIAGNOSIS — K7469 Other cirrhosis of liver: Secondary | ICD-10-CM | POA: Diagnosis not present

## 2023-06-18 DIAGNOSIS — I728 Aneurysm of other specified arteries: Secondary | ICD-10-CM | POA: Diagnosis not present

## 2023-06-18 DIAGNOSIS — Z95828 Presence of other vascular implants and grafts: Secondary | ICD-10-CM | POA: Diagnosis not present

## 2023-06-18 DIAGNOSIS — E876 Hypokalemia: Secondary | ICD-10-CM | POA: Diagnosis not present

## 2023-06-27 DIAGNOSIS — Z01419 Encounter for gynecological examination (general) (routine) without abnormal findings: Secondary | ICD-10-CM | POA: Diagnosis not present

## 2023-06-27 DIAGNOSIS — K7469 Other cirrhosis of liver: Secondary | ICD-10-CM | POA: Diagnosis not present

## 2023-08-16 DIAGNOSIS — M79605 Pain in left leg: Secondary | ICD-10-CM | POA: Diagnosis not present

## 2023-09-25 DIAGNOSIS — I10 Essential (primary) hypertension: Secondary | ICD-10-CM | POA: Diagnosis not present

## 2023-09-25 DIAGNOSIS — E7800 Pure hypercholesterolemia, unspecified: Secondary | ICD-10-CM | POA: Diagnosis not present

## 2023-09-25 DIAGNOSIS — Z0000 Encounter for general adult medical examination without abnormal findings: Secondary | ICD-10-CM | POA: Diagnosis not present

## 2023-09-25 DIAGNOSIS — E559 Vitamin D deficiency, unspecified: Secondary | ICD-10-CM | POA: Diagnosis not present

## 2023-09-25 DIAGNOSIS — K7460 Unspecified cirrhosis of liver: Secondary | ICD-10-CM | POA: Diagnosis not present

## 2023-09-25 DIAGNOSIS — M79605 Pain in left leg: Secondary | ICD-10-CM | POA: Diagnosis not present

## 2023-10-09 DIAGNOSIS — M79605 Pain in left leg: Secondary | ICD-10-CM | POA: Diagnosis not present

## 2023-10-12 ENCOUNTER — Emergency Department (HOSPITAL_BASED_OUTPATIENT_CLINIC_OR_DEPARTMENT_OTHER)
Admission: EM | Admit: 2023-10-12 | Discharge: 2023-10-12 | Disposition: A | Discharge status: Home / Self Care | Attending: Emergency Medicine | Admitting: Emergency Medicine

## 2023-10-12 ENCOUNTER — Emergency Department (HOSPITAL_BASED_OUTPATIENT_CLINIC_OR_DEPARTMENT_OTHER)

## 2023-10-12 ENCOUNTER — Other Ambulatory Visit: Payer: Self-pay

## 2023-10-12 ENCOUNTER — Encounter (HOSPITAL_BASED_OUTPATIENT_CLINIC_OR_DEPARTMENT_OTHER): Payer: Self-pay

## 2023-10-12 DIAGNOSIS — M25552 Pain in left hip: Secondary | ICD-10-CM | POA: Insufficient documentation

## 2023-10-12 DIAGNOSIS — M7989 Other specified soft tissue disorders: Secondary | ICD-10-CM | POA: Diagnosis not present

## 2023-10-12 DIAGNOSIS — M79652 Pain in left thigh: Secondary | ICD-10-CM | POA: Diagnosis not present

## 2023-10-12 DIAGNOSIS — M79605 Pain in left leg: Secondary | ICD-10-CM | POA: Diagnosis not present

## 2023-10-12 DIAGNOSIS — M1612 Unilateral primary osteoarthritis, left hip: Secondary | ICD-10-CM | POA: Diagnosis not present

## 2023-10-12 MED ORDER — DICLOFENAC SODIUM 1 % EX GEL: 4.0000 g | Freq: Four times a day (QID) | CUTANEOUS | 1 refills | Status: AC

## 2023-10-12 NOTE — ED Provider Notes (Signed)
  EMERGENCY DEPARTMENT AT [facility] Provider Note   CSN: [id number] Arrival date & time: 10/12/23  6644     Patient presents with: Leg Pain   Yvonne Chapman is a 63 y.o. female.   Patient presents to the emergency department for evaluation of left leg and hip pain ongoing for about 3 weeks.  Patient reports pain that started in her left hip that is worse with weightbearing and movement.  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  She was seen by [facility] walk-in clinic about 3 to 4 days ago.  She had an x-ray performed, but has not heard results yet.  She reports increasing pain and swelling down into her left thigh and then also tingling in her left toes which has started in the interim.  No fevers.  No bowel or bladder incontinence.  Sometimes her toes feel cold.  She does have history of ankle surgery on that side.  She denies back pain.       Prior to Admission medications   Medication Sig Start Date End Date Taking? Authorizing Provider  amLODipine (NORVASC) 2.5 MG tablet Take 2.5 mg by mouth at bedtime. 08/23/21  Yes [provider]  Cholecalciferol (VITAMIN D PO) Take 1 tablet by mouth daily. Patient not taking: Reported on 02/28/2023    [provider]  fexofenadine (ALLEGRA) 180 MG tablet Take 180 mg by mouth daily.    [provider]  rOPINIRole  (REQUIP ) 0.25 MG tablet Take 1 tablet (0.25 mg total) by mouth at bedtime. Patient not taking: Reported on 02/28/2023 12/03/22   Crain, Whitney L, PA    Allergies: Amoxicillin, Cefuroxime, Cyclobenzaprine, Diphenhydramine, Elemental sulfur , Floxin [ofloxacin], Metronidazole, Promethazine hcl, and Sulfamethoxazole    Review of Systems  Updated Vital Signs BP (!) 150/76   Pulse 63   Temp 98.1 F (36.7 C) (Oral)   Resp 16   Ht 5' 7 (1.702 m)   Wt 104.8 kg   SpO2 98%   BMI 36.18 kg/m   Physical Exam Vitals and nursing note reviewed.   Constitutional:      Appearance: She is well-developed.  HENT:     Head: Normocephalic and atraumatic.   Eyes:     Pupils: Pupils are equal, round, and reactive to light.    Cardiovascular:     Pulses: Normal pulses. No decreased pulses.   Musculoskeletal:        General: Tenderness present.     Cervical back: Normal range of motion and neck supple.     Lumbar back: No tenderness or bony tenderness.     Right hip: No tenderness. Normal range of motion.     Left hip: Tenderness present. Normal range of motion.     Right upper leg: No swelling or tenderness.     Left upper leg: Swelling (trace) present. No tenderness.     Right knee: No tenderness.     Left knee: No tenderness.     Right lower leg: No swelling or tenderness.     Left lower leg: No swelling or tenderness.     Right ankle: No tenderness.     Left ankle: No tenderness.     Right foot: Normal capillary refill. No tenderness. Normal pulse.     Left foot: Normal capillary refill. No tenderness. Normal pulse.       Legs:     Comments: 2+ DP pulses readily palpated bilaterally   Skin:  General: Skin is warm and dry.   Neurological:     Mental Status: She is alert.     Sensory: No sensory deficit.     Comments: Motor, sensation, and vascular distal to the injury is fully intact.   Psychiatric:        Mood and Affect: Mood normal.     (all labs ordered are listed, but only abnormal results are displayed) Labs Reviewed - No data to display  EKG: None  Radiology: US  Venous Img Lower  Left (DVT Study) Result Date: 10/12/2023 CLINICAL DATA:  Left lateral hip and buttock pain EXAM: LEFT LOWER EXTREMITY VENOUS DOPPLER ULTRASOUND TECHNIQUE: Gray-scale sonography with compression, as well as color and duplex ultrasound, were performed to evaluate the deep venous system(s) from the level of the common femoral vein through the popliteal and proximal calf veins. COMPARISON:  None Available. FINDINGS: VENOUS Normal  compressibility of the common femoral, superficial femoral, and popliteal veins, as well as the visualized calf veins. Visualized portions of profunda femoral vein and great saphenous vein unremarkable. No filling defects to suggest DVT on grayscale or color Doppler imaging. Doppler waveforms show normal direction of venous flow, normal respiratory plasticity and response to augmentation. Limited views of the contralateral common femoral vein are unremarkable. OTHER None. Limitations: none IMPRESSION: Negative. Electronically Signed   By: Fernando Hoyer M.D.   On: 10/12/2023 12:32   DG Hip Unilat W or Wo Pelvis 2-3 Views Left Result Date: 10/12/2023 CLINICAL DATA:  Left hip pain and swelling for 1 month. EXAM: DG HIP (WITH OR WITHOUT PELVIS) 2-3V LEFT COMPARISON:  None Available. FINDINGS: There is no evidence of hip fracture or dislocation. No significant joint space narrowing is noted. Minimal osteophyte formation is seen involving left hip. 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 No acute abnormality seen. Electronically Signed   By: Rosalene Colon M.D.   On: 10/12/2023 11:55    ED Course  Patient seen and examined. History obtained directly from patient.   Labs/EKG: None  Imaging: Ordered x-ray left hip and DVT study left leg  Medications/Fluids: None ordered  Most recent vital signs reviewed and are as follows: BP (!) 150/76   Pulse 63   Temp 98.1 F (36.7 C) (Oral)   Resp 16   Ht 5' 7 (1.702 m)   Wt 104.8 kg   SpO2 98%   BMI 36.18 kg/m   Initial impression: Left hip pain progressing to left thigh pain and reported swelling.  Rule out DVT.  12:49 PM Reassessment performed. Patient appears stable, comfortable.  Imaging personally visualized and interpreted including: X-ray of the hip, agree no dislocation or fracture.  Results reviewed of lower extremity DVT study, were negative.  Reviewed pertinent lab work and imaging with patient at bedside. Questions  answered.   Most current vital signs reviewed and are as follows: BP 129/68 (BP Location: Right Arm)   Pulse 61   Temp 97.6 F (36.4 C) (Oral)   Resp 16   Ht 5' 7 (1.702 m)   Wt 104.8 kg   SpO2 100%   BMI 36.18 kg/m   Plan: Discharge to home.   Prescriptions written for: Voltaren .  Patient would prefer to avoid oral NSAID medications due to history of liver problems from sarcoidosis.  Other home care instructions discussed: Gentle stretching, rest.  ED return instructions discussed: Worsening pain, swelling, difficulty breathing, chest pain, new or worsening symptoms.  Follow-up instructions discussed: Patient encouraged to follow-up with their  PCP or orthopedist in the next 1 week.  Patient is an established patient with [facility] and would prefer to go see them.      Procedures   Medications Ordered in the ED - No data to display                                  Medical Decision Making Amount and/or Complexity of Data Reviewed Radiology: ordered.   Patient with left hip and thigh pain.  No rash to suggest cellulitis or shingles.  X-ray of the hip with minimal arthritis only.  Patient with some subjective swelling, DVT study ordered and was negative.  Lower extremity is neurovascularly intact.  She would likely benefit from orthopedic follow-up.  Will continue symptomatic care.  Patient is interested in physical therapy and will follow-up regarding this.     Final diagnoses:  Left hip pain  Left thigh pain    ED Discharge Orders          Ordered    diclofenac  Sodium (VOLTAREN ) 1 % GEL  4 times daily        10/12/23 1248               Lyna Sandhoff, PA-C 10/12/23 1251    Albertus Hughs, DO 10/12/23 1418

## 2023-10-12 NOTE — ED Triage Notes (Signed)
 Pt reports left leg and hip pain x 3 weeks. States that she seen her PCP and had xrays done but has not got the results back. States that left leg is swollen and painful.

## 2023-10-12 NOTE — ED Notes (Signed)
 Patient transported to X-ray

## 2023-10-12 NOTE — Discharge Instructions (Signed)
 Please read and follow all provided instructions.  Your diagnoses today include:  1. Left hip pain   2. Left thigh pain     Tests performed today include: An x-ray of the affected area - does NOT show any broken bones, shows mild arthritis in the hip Ultrasound of the left lower extremity does not show any sign of blood clot Vital signs. See below for your results today.   Medications prescribed:  Voltaren  gel -topical anti-inflammatory gel  Take any prescribed medications only as directed.  Home care instructions:  Follow any educational materials contained in this packet Follow R.I.C.E. Protocol: R - rest your injury  I  - use ice on injury without applying directly to skin C - compress injury with bandage or splint E - elevate the injury as much as possible  Follow-up instructions: Please follow-up with your primary care provider or your orthopedic physician (bone specialist) if you continue to have significant pain in 1 week. In this case you may have a more severe injury that requires further care.   Return instructions:  Please return if your toes or feet are numb or tingling, appear gray or blue, or you have severe pain (also elevate the leg and loosen splint or wrap if you were given one) Please return to the Emergency Department if you experience worsening symptoms.  Please return if you have any other emergent concerns.  Additional Information:  Your vital signs today were: BP 129/68 (BP Location: Right Arm)   Pulse 61   Temp 97.6 F (36.4 C) (Oral)   Resp 16   Ht 5' 7 (1.702 m)   Wt 104.8 kg   SpO2 100%   BMI 36.18 kg/m  If your blood pressure (BP) was elevated above 135/85 this visit, please have this repeated by your doctor within one month.  --------------

## 2023-10-17 DIAGNOSIS — M7062 Trochanteric bursitis, left hip: Secondary | ICD-10-CM | POA: Diagnosis not present

## 2023-10-18 DIAGNOSIS — M7062 Trochanteric bursitis, left hip: Secondary | ICD-10-CM | POA: Diagnosis not present

## 2023-11-12 DIAGNOSIS — Z1231 Encounter for screening mammogram for malignant neoplasm of breast: Secondary | ICD-10-CM | POA: Diagnosis not present

## 2024-01-07 DIAGNOSIS — Z95828 Presence of other vascular implants and grafts: Secondary | ICD-10-CM | POA: Diagnosis not present

## 2024-01-07 DIAGNOSIS — K769 Liver disease, unspecified: Secondary | ICD-10-CM | POA: Diagnosis not present

## 2024-01-07 DIAGNOSIS — K7469 Other cirrhosis of liver: Secondary | ICD-10-CM | POA: Diagnosis not present

## 2024-01-07 DIAGNOSIS — Z79899 Other long term (current) drug therapy: Secondary | ICD-10-CM | POA: Diagnosis not present

## 2024-01-31 DIAGNOSIS — E876 Hypokalemia: Secondary | ICD-10-CM | POA: Diagnosis not present

## 2024-02-22 DIAGNOSIS — K7460 Unspecified cirrhosis of liver: Secondary | ICD-10-CM | POA: Diagnosis not present

## 2024-02-22 DIAGNOSIS — I10 Essential (primary) hypertension: Secondary | ICD-10-CM | POA: Diagnosis not present

## 2024-02-22 DIAGNOSIS — E876 Hypokalemia: Secondary | ICD-10-CM | POA: Diagnosis not present

## 2024-02-22 DIAGNOSIS — M79605 Pain in left leg: Secondary | ICD-10-CM | POA: Diagnosis not present

## 2024-02-27 ENCOUNTER — Ambulatory Visit: Admitting: Physician Assistant

## 2024-02-29 ENCOUNTER — Ambulatory Visit: Admitting: Physician Assistant

## 2024-02-29 DIAGNOSIS — M25552 Pain in left hip: Secondary | ICD-10-CM

## 2024-02-29 DIAGNOSIS — M1612 Unilateral primary osteoarthritis, left hip: Secondary | ICD-10-CM | POA: Diagnosis not present

## 2024-02-29 NOTE — Progress Notes (Signed)
 Office Visit Note   Patient: Yvonne Chapman           Date of Birth: 02/11/1961           MRN: [id number] Visit Date: 02/29/2024              Requested by: Arloa Elsie SAUNDERS, MD [phone] [place],  [place] [address] PCP: Arloa Elsie SAUNDERS, MD  Chief Complaint  Patient presents with   Left Leg - Pain      HPI: 63 y/o female with left leg pain.  She was seen in the ER on 10/12/23 with history of left leg and hip pain ongoing for about 3 weeks. She denies injury.  The pain is exaserbated with weight bearing activities.  She has been caring for an elderly family member.  She was given Voltaren . Patient would prefer to avoid oral NSAID medications due to history of liver problems from sarcoidosis.   Assessment & Plan: Visit Diagnoses:  1. Pain in left hip   2. Arthritis of left hip     Plan: She requested to go to physical therapy for conservative management of left hip pain.  If her pain dose not improve we will plan to x ray her lumbar spine in the future.    Follow-Up Instructions: Return if symptoms worsen or fail to improve.   Ortho Exam  Patient is alert, oriented, no adenopathy, well-dressed, normal affect, normal respiratory effort. Normal active and passive left hip ROM.  I can not reproduce lateral thigh/hip pain with palpation.  She has no obvious edema or cellulitis.  When she is weight bearing she has left hip/thigh pain.    Imaging: FINDINGS: There is no evidence of hip fracture or dislocation. No significant joint space narrowing is noted. Minimal osteophyte formation is seen involving left hip.   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 No acute abnormality seen.  X ary reviewed.  Labs: Lab Results  Component Value Date   HGBA1C 4.9 07/25/2016   REPTSTATUS 12/04/2022 FINAL 12/03/2022   CULT (A) 12/03/2022    <10,000 COLONIES/mL INSIGNIFICANT GROWTH Performed at [facility], [address], [place], [place]  [address]      Lab Results  Component Value Date   ALBUMIN 3.4 (L) 05/30/2022   ALBUMIN 3.2 (L) 05/13/2022   ALBUMIN 3.2 (L) 12/22/2020    No results found for: MG No results found for: VD25OH  No results found for: PREALBUMIN    Latest Ref Rng & Units 05/30/2022    7:49 AM 05/13/2022    5:58 AM 12/22/2020    8:02 AM  CBC EXTENDED  WBC 4.0 - 10.5 K/uL 3.2  3.2  4.0   RBC 3.87 - 5.11 MIL/uL 4.69  4.81  4.60   Hemoglobin 12.0 - 15.0 g/dL 86.9  86.5  87.0   HCT 36.0 - 46.0 % 40.9  39.9  38.7   Platelets 150 - 400 K/uL 177  155  144   NEUT# 1.7 - 7.7 K/uL  2.2  2.5   Lymph# 0.7 - 4.0 K/uL  0.5  0.9      There is no height or weight on file to calculate BMI.  Orders:  Orders Placed This Encounter  Procedures   Ambulatory referral to Physical Therapy   No orders of the defined types were placed in this encounter.    Procedures: No procedures performed  Clinical Data: No additional findings.  ROS:  All  other systems negative, except as noted in the HPI. Review of Systems  Objective: Vital Signs: There were no vitals taken for this visit.  Specialty Comments:  No specialty comments available.  PMFS History: Patient Active Problem List   Diagnosis Date Noted   Chest pain, precordial 09/02/2021   Essential hypertension 09/01/2021   Acute maxillary sinusitis 05/30/2021   Anxiety disorder 05/30/2021   Chronic hepatitis (HCC) 05/30/2021   Constipation 05/30/2021   Headache disorder 05/30/2021   Headache 05/30/2021   Heart murmur 05/30/2021   Hereditary and idiopathic neuropathy, unspecified 05/30/2021   Hyperlipidemia 05/30/2021   Insomnia 05/30/2021   Kidney stone 05/30/2021   Leukopenia 05/30/2021   Localized edema 05/30/2021   Lower urinary tract symptoms 05/30/2021   Other shoulder lesions, unspecified shoulder 05/30/2021   Pure hypercholesterolemia 05/30/2021   Splenic artery aneurysm 05/30/2021   Vitamin D deficiency 05/30/2021   Low back pain  05/24/2020   Overweight 04/01/2012   Cirrhosis of liver (HCC) 03/27/2012   Portal hypertension (HCC) 03/27/2012   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   Sarcoidosis    Vaginitis    Dysmenorrhea    Fibroids    Bacterial vaginosis    Yeast infection    Trichomoniasis    Herpes    Monilia infection    Anemia    Ovarian cyst    Past Medical History:  Diagnosis Date   Abnormal uterine bleeding    Anemia    Arthritis    Back pain    Bacterial vaginosis    Dysmenorrhea    Endometriosis    Fatty liver    Fibroids    GERD (gastroesophageal reflux disease)    Gonorrhea    Heart murmur    slight   Herpes    History of chicken pox    History of kidney stones    Hypertension    Lactose intolerance    Liver disorder    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   Monilia infection    Ovarian cyst    PONV (postoperative nausea and vomiting)    Sarcoidosis     Family History  Problem Relation Age of Onset   Hypertension Mother    Hyperlipidemia Mother    Hypertension Father    Diabetes Father    Hyperlipidemia Father    Cancer Father     Past Surgical History:  Procedure Laterality Date   ABDOMINAL HYSTERECTOMY     CYSTOSCOPY WITH RETROGRADE PYELOGRAM, URETEROSCOPY AND STENT PLACEMENT Left 06/06/2022   Procedure: CYSTOSCOPY WITH RETROGRADE PYELOGRAM, DIAGNOSTIC URETEROSCOPY AND STENT PLACEMENT;  Surgeon: Elisabeth Valli BIRCH, MD;  Location: [facility];  Service: Urology;  Laterality: Left;  1 HR   DILATION AND CURETTAGE OF UTERUS     FOOT SURGERY     HOLMIUM LASER APPLICATION Left 06/06/2022   Procedure: HOLMIUM LASER APPLICATION;  Surgeon: Elisabeth Valli BIRCH, MD;  Location: [facility];  Service: Urology;  Laterality: Left;   LAPAROSCOPIC OVARIAN CYSTECTOMY     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     10 years ago   WISDOM TOOTH EXTRACTION     Social History   Occupational History   Occupation: Engineer, Building Services    Comment: [employer]  Tobacco Use   Smoking status: Never    Smokeless tobacco: Never  Vaping Use   Vaping status: Never Used  Substance and Sexual Activity   Alcohol use: No   Drug use: No   Sexual activity: Not Currently    Partners: Male

## 2024-03-02 ENCOUNTER — Encounter: Payer: Self-pay | Admitting: Physician Assistant

## 2024-03-03 ENCOUNTER — Encounter: Payer: Self-pay | Admitting: Radiology

## 2024-03-19 ENCOUNTER — Ambulatory Visit: Admitting: Rehabilitative and Restorative Service Providers"

## 2024-03-19 ENCOUNTER — Encounter: Payer: Self-pay | Admitting: Rehabilitative and Restorative Service Providers"

## 2024-03-19 DIAGNOSIS — M6281 Muscle weakness (generalized): Secondary | ICD-10-CM

## 2024-03-19 DIAGNOSIS — M25652 Stiffness of left hip, not elsewhere classified: Secondary | ICD-10-CM | POA: Diagnosis not present

## 2024-03-19 DIAGNOSIS — R262 Difficulty in walking, not elsewhere classified: Secondary | ICD-10-CM | POA: Diagnosis not present

## 2024-03-19 NOTE — Therapy (Signed)
 OUTPATIENT PHYSICAL THERAPY LOWER EXTREMITY EVALUATION   Patient Name: Yvonne Chapman MRN: [id number] DOB:01/14/1961, 63 y.o., female Today's Date: 03/19/2024  END OF SESSION:  PT End of Session - 03/19/24 1747     Visit Number 1    Number of Visits 12    Date for Recertification  05/14/24    Authorization Type [employer] FEDERAL    Progress Note Due on Visit 12    PT Start Time 1600    PT Stop Time 1643    PT Time Calculation (min) 43 min    Activity Tolerance Patient tolerated treatment well;No increased pain;Patient limited by pain    Behavior During Therapy [facility] for tasks assessed/performed          Past Medical History:  Diagnosis Date   Abnormal uterine bleeding    Anemia    Arthritis    Back pain    Bacterial vaginosis    Dysmenorrhea    Endometriosis    Fatty liver    Fibroids    GERD (gastroesophageal reflux disease)    Gonorrhea    Heart murmur    slight   Herpes    History of chicken pox    History of kidney stones    Hypertension    Lactose intolerance    Liver disorder    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   Monilia infection    Ovarian cyst    PONV (postoperative nausea and vomiting)    Sarcoidosis    Past Surgical History:  Procedure Laterality Date   ABDOMINAL HYSTERECTOMY     CYSTOSCOPY WITH RETROGRADE PYELOGRAM, URETEROSCOPY AND STENT PLACEMENT Left 06/06/2022   Procedure: CYSTOSCOPY WITH RETROGRADE PYELOGRAM, DIAGNOSTIC URETEROSCOPY AND STENT PLACEMENT;  Surgeon: Elisabeth Valli BIRCH, MD;  Location: [facility];  Service: Urology;  Laterality: Left;  1 HR   DILATION AND CURETTAGE OF UTERUS     FOOT SURGERY     HOLMIUM LASER APPLICATION Left 06/06/2022   Procedure: HOLMIUM LASER APPLICATION;  Surgeon: Elisabeth Valli BIRCH, MD;  Location: [facility];  Service: Urology;  Laterality: Left;   LAPAROSCOPIC OVARIAN CYSTECTOMY     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     10 years ago   WISDOM TOOTH EXTRACTION     Patient Active Problem List   Diagnosis Date Noted   Chest pain, precordial 09/02/2021    Essential hypertension 09/01/2021   Acute maxillary sinusitis 05/30/2021   Anxiety disorder 05/30/2021   Chronic hepatitis (HCC) 05/30/2021   Constipation 05/30/2021   Headache disorder 05/30/2021   Headache 05/30/2021   Heart murmur 05/30/2021   Hereditary and idiopathic neuropathy, unspecified 05/30/2021   Hyperlipidemia 05/30/2021   Insomnia 05/30/2021   Kidney stone 05/30/2021   Leukopenia 05/30/2021   Localized edema 05/30/2021   Lower urinary tract symptoms 05/30/2021   Other shoulder lesions, unspecified shoulder 05/30/2021   Pure hypercholesterolemia 05/30/2021   Splenic artery aneurysm 05/30/2021   Vitamin D deficiency 05/30/2021   Low back pain 05/24/2020   Overweight 04/01/2012   Cirrhosis of liver (HCC) 03/27/2012   Portal hypertension (HCC) 03/27/2012   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   Sarcoidosis    Vaginitis    Dysmenorrhea    Fibroids    Bacterial vaginosis    Yeast infection    Trichomoniasis    Herpes    Monilia infection    Anemia    Ovarian cyst     PCP: Elsie JONELLE Lesches, MD  REFERRING PROVIDER: Maurilio CHRISTELLA Collet, PA-C  REFERRING DIAG:  M25.552 (ICD-10-CM) - Pain in left hip    THERAPY DIAG:  Difficulty in walking, not elsewhere classified - Plan: PT plan of care cert/re-cert  Muscle weakness (generalized) - Plan: PT plan of care cert/re-cert  Stiffness of left hip, not elsewhere classified - Plan: PT plan of care cert/re-cert  Rationale for Evaluation and Treatment: Rehabilitation  ONSET DATE: 2-3 months ago  SUBJECTIVE:   SUBJECTIVE STATEMENT: Yvonne Chapman notes left lateral thigh pain at night, particularly lying on the left side.  PERTINENT HISTORY: Left ankle fracture, OA, kidney stones and HTN  PAIN:  Are you having pain? Yes: NPRS scale: 3/10 this week Pain location: Left lateral thigh Pain description: Stiff Aggravating factors: At night, lying on the left side, first thing in the  morning Relieving factors: Movement, rare tylenol   PRECAUTIONS: None  RED FLAGS: None   WEIGHT BEARING RESTRICTIONS: No  FALLS:  Has patient fallen in last 6 months? No  LIVING ENVIRONMENT: Lives with: lives with their family and lives with an adult companion Lives in: House/apartment Stairs: Some difficulty with stairs Has following equipment at home: None  OCCUPATION: Retired, part-time at a social worker firm  PLOF: Independent  PATIENT GOALS: Get rid of the pain, minimize it  NEXT MD VISIT: As needed  OBJECTIVE:  Note: Objective measures were completed at Evaluation unless otherwise noted.  DIAGNOSTIC FINDINGS: There is no evidence of hip fracture or dislocation. No significant joint space narrowing is noted. Minimal osteophyte formation is seen involving left hip.   PATIENT SURVEYS:  PSFS: THE PATIENT SPECIFIC FUNCTIONAL SCALE  Place score of 0-10 (0 = unable to perform activity and 10 = able to perform activity at the same level as before injury or problem)  Activity Date: 03/19/2024    Standing from a seated posture 4    2.   Rt knee movement 5    3.   Sleeping on the left side 3    4.      Total Score 4      Total Score = Sum of activity scores/number of activities  Minimally Detectable Change: 3 points (for single activity); 2 points (for average score)  [facility] (nd). The Patient Specific Functional Scale . Retrieved from [email or web address].[email or web address]   COGNITION: Overall cognitive status: Within functional limits for tasks assessed     SENSATION: WFL  EDEMA:  Not noted  MUSCLE LENGTH: Hamstrings: Right 40 deg; Left 40 deg  LOWER EXTREMITY ROM:  Active ROM Left/Right 03/19/2024   Hip flexion 90/90   Hip extension    Hip abduction    Hip adduction    Hip internal rotation 20/10   Hip external rotation 24/39   Knee flexion    Knee extension    Ankle dorsiflexion    Ankle plantarflexion     Ankle inversion    Ankle eversion     (Blank rows = not tested)  LOWER EXTREMITY MMT:  MMT Left/Right 03/19/2024    Hip flexion    Hip extension    Hip abduction 4+/4   Hip adduction    Hip internal rotation    Hip external rotation    Knee flexion    Knee extension    Ankle dorsiflexion    Ankle plantarflexion    Ankle inversion    Ankle eversion     (Blank rows = not tested)  GAIT: Distance walked: 100 feet Assistive device utilized: None Level of assistance: Complete Independence Comments: Yvonne Chapman does well once she gets warmed  up.  First few steps after waking up or sitting are stiff.                                                                                                                         TREATMENT DATE: 03/19/2024 Supine figure 4 stretch 4 x 20 seconds Supine gluteal stretch (knee to opposite shoulder with opposite leg straight) 4 x 20 seconds Supine IT Band stretch (hamstrings stretch with hip adduction and IR, no strap used) 4 x 20 seconds left only Hip hike in door fram 5 x 3 seconds bilateral and at counter top 10 x 3 seconds  02469: Brief review of hip anatomy; exam findings and started day 1 HEP   PATIENT EDUCATION:  Education details: See above Person educated: Patient and Parent Education method: Explanation, Demonstration, Tactile cues, Verbal cues, and Handouts Education comprehension: verbalized understanding, returned demonstration, verbal cues required, tactile cues required, and needs further education  HOME EXERCISE PROGRAM: Access Code: [email or web address] URL: [email or web address] Date: 03/19/2024 Prepared by: Lamar Chapman  Exercises - Supine Figure 4 Piriformis Stretch  - 2-3 x daily - 7 x weekly - 1 sets - 5 reps - 20 seconds hold - Supine Gluteus Stretch  - 2-3 x daily - 7 x weekly - 1 sets - 5 reps - 20 seconds hold - Supine ITB Stretch with Strap  - 2-3 x daily - 7 x weekly - 1 sets - 5 reps - 20 seconds hold -  Standing Hip Hiking  - 3-5 x daily - 7 x weekly - 1 sets - 10 reps - 3 seconds hold  ASSESSMENT:  CLINICAL IMPRESSION: Patient is a 63 y.o. female who was seen today for physical therapy evaluation and treatment for  M25.552 (ICD-10-CM) - Pain in left hip  .  Significant clinical findings include limited left hip ER AROM, difficulty sleeping on the left side and stiffness with change of position.  Yvonne Chapman was started on an appropriate HEP to address impairments noted at today's evaluation.  OBJECTIVE IMPAIRMENTS: Abnormal gait, decreased activity tolerance, decreased endurance, decreased knowledge of condition, difficulty walking, decreased ROM, decreased strength, impaired perceived functional ability, impaired flexibility, obesity, and pain.   ACTIVITY LIMITATIONS: sitting, standing, squatting, sleeping, stairs, and locomotion level  PARTICIPATION LIMITATIONS: driving, community activity, occupation, and church  PERSONAL FACTORS: Left ankle fracture, OA, kidney stones and HTN are also affecting patient's functional outcome.   REHAB POTENTIAL: Good  CLINICAL DECISION MAKING: Stable/uncomplicated  EVALUATION COMPLEXITY: Low   GOALS: Goals reviewed with patient? Yes  SHORT TERM GOALS: Target date: 04/16/2024 Yvonne Chapman will be independent with her day 1 HEP Baseline: Started 03/19/2024 Goal status: INITIAL  2.  Improve left hip ER AROM to 40 degrees Baseline: 24 degrees Goal status: INITIAL   LONG TERM GOALS: Target date: 05/15/2023  Improve Patient Specific Functional Score to 6 Baseline: 4 Goal status: INITIAL  2.  Improve left hip pain to consistently 2/10 or better Baseline: 3/10 Goal status: INITIAL  3.  Improve bilateral hip abduction strength as assessed by MMT Baseline: 4+/4 out of 5 MMT Goal status: INITIAL  4.  Yvonne Chapman will be able to sleep on the left side Baseline: Unable at evaluation Goal status: INITIAL  5.  Yvonne Chapman will be independent with her long-term HEP  at DC Baseline: Started 03/19/2024 Goal status: INITIAL   PLAN:  PT FREQUENCY: 1-2x/week  PT DURATION: 8 weeks  PLANNED INTERVENTIONS: 97110-Therapeutic exercises, 97530- Therapeutic activity, [id number]- Neuromuscular re-education, 97535- Self Care, [id number]- Manual therapy, [id number]- Gait training, [id number] (1-2 muscles), [id number] (3+ muscles)- Dry Needling, Patient/Family education, Balance training, Stair training, Cryotherapy, and Moist heat  PLAN FOR NEXT SESSION: Review HEP.  Most significant clinical findings were ER tightness along with some hip abductors weakness.   Yvonne Chapman, PT, MPT 03/19/2024, 6:04 PM

## 2024-03-31 DIAGNOSIS — M79605 Pain in left leg: Secondary | ICD-10-CM | POA: Diagnosis not present

## 2024-03-31 DIAGNOSIS — I10 Essential (primary) hypertension: Secondary | ICD-10-CM | POA: Diagnosis not present

## 2024-03-31 DIAGNOSIS — N3001 Acute cystitis with hematuria: Secondary | ICD-10-CM | POA: Diagnosis not present

## 2024-03-31 DIAGNOSIS — R350 Frequency of micturition: Secondary | ICD-10-CM | POA: Diagnosis not present

## 2024-04-10 ENCOUNTER — Encounter: Payer: Self-pay | Admitting: Rehabilitative and Restorative Service Providers"

## 2024-04-10 ENCOUNTER — Ambulatory Visit: Admitting: Rehabilitative and Restorative Service Providers"

## 2024-04-10 DIAGNOSIS — M6281 Muscle weakness (generalized): Secondary | ICD-10-CM

## 2024-04-10 DIAGNOSIS — R399 Unspecified symptoms and signs involving the genitourinary system: Secondary | ICD-10-CM | POA: Diagnosis not present

## 2024-04-10 DIAGNOSIS — R262 Difficulty in walking, not elsewhere classified: Secondary | ICD-10-CM

## 2024-04-10 DIAGNOSIS — M25652 Stiffness of left hip, not elsewhere classified: Secondary | ICD-10-CM | POA: Diagnosis not present

## 2024-04-10 DIAGNOSIS — N390 Urinary tract infection, site not specified: Secondary | ICD-10-CM | POA: Diagnosis not present

## 2024-04-10 NOTE — Therapy (Signed)
 OUTPATIENT PHYSICAL THERAPY LOWER EXTREMITY TREATMENT   Patient Name: Yvonne Chapman MRN: [id number] DOB:11-15-1960, 63 y.o., female Today's Date: 04/10/2024  END OF SESSION:  PT End of Session - 04/10/24 1519     Visit Number 2    Number of Visits 12    Date for Recertification  05/14/24    Authorization Type [employer] FEDERAL    Progress Note Due on Visit 12    PT Start Time 1519    PT Stop Time 1559    PT Time Calculation (min) 40 min    Activity Tolerance Patient tolerated treatment well;No increased pain;Patient limited by pain    Behavior During Therapy [facility] for tasks assessed/performed           Past Medical History:  Diagnosis Date   Abnormal uterine bleeding    Anemia    Arthritis    Back pain    Bacterial vaginosis    Dysmenorrhea    Endometriosis    Fatty liver    Fibroids    GERD (gastroesophageal reflux disease)    Gonorrhea    Heart murmur    slight   Herpes    History of chicken pox    History of kidney stones    Hypertension    Lactose intolerance    Liver disorder    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   Monilia infection    Ovarian cyst    PONV (postoperative nausea and vomiting)    Sarcoidosis    Past Surgical History:  Procedure Laterality Date   ABDOMINAL HYSTERECTOMY     CYSTOSCOPY WITH RETROGRADE PYELOGRAM, URETEROSCOPY AND STENT PLACEMENT Left 06/06/2022   Procedure: CYSTOSCOPY WITH RETROGRADE PYELOGRAM, DIAGNOSTIC URETEROSCOPY AND STENT PLACEMENT;  Surgeon: Elisabeth Valli BIRCH, MD;  Location: [facility];  Service: Urology;  Laterality: Left;  1 HR   DILATION AND CURETTAGE OF UTERUS     FOOT SURGERY     HOLMIUM LASER APPLICATION Left 06/06/2022   Procedure: HOLMIUM LASER APPLICATION;  Surgeon: Elisabeth Valli BIRCH, MD;  Location: [facility];  Service: Urology;  Laterality: Left;   LAPAROSCOPIC OVARIAN CYSTECTOMY     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     10 years ago   WISDOM TOOTH EXTRACTION     Patient Active Problem List   Diagnosis Date Noted   Chest pain, precordial 09/02/2021    Essential hypertension 09/01/2021   Acute maxillary sinusitis 05/30/2021   Anxiety disorder 05/30/2021   Chronic hepatitis (HCC) 05/30/2021   Constipation 05/30/2021   Headache disorder 05/30/2021   Headache 05/30/2021   Heart murmur 05/30/2021   Hereditary and idiopathic neuropathy, unspecified 05/30/2021   Hyperlipidemia 05/30/2021   Insomnia 05/30/2021   Kidney stone 05/30/2021   Leukopenia 05/30/2021   Localized edema 05/30/2021   Lower urinary tract symptoms 05/30/2021   Other shoulder lesions, unspecified shoulder 05/30/2021   Pure hypercholesterolemia 05/30/2021   Splenic artery aneurysm 05/30/2021   Vitamin D deficiency 05/30/2021   Low back pain 05/24/2020   Overweight 04/01/2012   Cirrhosis of liver (HCC) 03/27/2012   Portal hypertension (HCC) 03/27/2012   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   Sarcoidosis    Vaginitis    Dysmenorrhea    Fibroids    Bacterial vaginosis    Yeast infection    Trichomoniasis    Herpes    Monilia infection    Anemia    Ovarian cyst     PCP: Elsie JONELLE Lesches, MD  REFERRING PROVIDER: Maurilio CHRISTELLA Collet, PA-C  REFERRING DIAG:  M25.552 (ICD-10-CM) - Pain in left hip    THERAPY DIAG:  Difficulty in walking, not elsewhere classified  Muscle weakness (generalized)  Stiffness of left hip, not elsewhere classified  Rationale for Evaluation and Treatment: Rehabilitation  ONSET DATE: 2-3 months ago  SUBJECTIVE:   SUBJECTIVE STATEMENT: Dhruvi notes less left lateral thigh pain at night, particularly lying on the left side.  She reports HEP compliance and positive progress overall since day 1 of PT.  PERTINENT HISTORY: Left ankle fracture, OA, kidney stones and HTN  PAIN:  Are you having pain? Yes: NPRS scale: 1/10 this week (was 3/10) Pain location: Left lateral thigh Pain description: Sore/pinch, less stiff Aggravating factors: Better At night, lying on the left side, first thing in the  morning Relieving factors: Movement, exercises  PRECAUTIONS: None  RED FLAGS: None   WEIGHT BEARING RESTRICTIONS: No  FALLS:  Has patient fallen in last 6 months? No  LIVING ENVIRONMENT: Lives with: lives with their family and lives with an adult companion Lives in: House/apartment Stairs: Some difficulty with stairs Has following equipment at home: None  OCCUPATION: Retired, part-time at a social worker firm  PLOF: Independent  PATIENT GOALS: Get rid of the pain, minimize it  NEXT MD VISIT: As needed  OBJECTIVE:  Note: Objective measures were completed at Evaluation unless otherwise noted.  DIAGNOSTIC FINDINGS: There is no evidence of hip fracture or dislocation. No significant joint space narrowing is noted. Minimal osteophyte formation is seen involving left hip.   PATIENT SURVEYS:  PSFS: THE PATIENT SPECIFIC FUNCTIONAL SCALE  Place score of 0-10 (0 = unable to perform activity and 10 = able to perform activity at the same level as before injury or problem)  Activity Date: 03/19/2024 04/10/2024   Standing from a seated posture 4 8.5   2.   Rt knee movement 5 7   3.   Sleeping on the left side 3 8.5   4.      Total Score 4      Total Score = Sum of activity scores/number of activities  Minimally Detectable Change: 3 points (for single activity); 2 points (for average score)  [facility] (nd). The Patient Specific Functional Scale . Retrieved from [email or web address]   COGNITION: Overall cognitive status: Within functional limits for tasks assessed     SENSATION: WFL  EDEMA:  Not noted  MUSCLE LENGTH: Hamstrings: Right 40 deg; Left 40 deg  LOWER EXTREMITY ROM:  Active ROM Left/Right 03/19/2024 Left/Right 12//2025  Hip flexion 90/90   Hip extension    Hip abduction    Hip adduction    Hip internal rotation 20/10   Hip external rotation 24/39   Knee flexion    Knee extension    Ankle  dorsiflexion    Ankle plantarflexion    Ankle inversion    Ankle eversion     (Blank rows = not tested)  LOWER EXTREMITY MMT:  MMT Left/Right 03/19/2024  Left/Right 12//2025  Hip flexion    Hip extension    Hip abduction 4+/4   Hip adduction    Hip internal rotation    Hip external rotation    Knee flexion    Knee extension    Ankle dorsiflexion    Ankle plantarflexion    Ankle inversion    Ankle eversion     (Blank rows = not tested)  GAIT: Distance walked: 100 feet Assistive device utilized: None Level of assistance: Complete Independence Comments: Teanna does well once she gets warmed  up.  First few steps after waking up or sitting are stiff.                                                                                                                         TREATMENT DATE:  04/10/2024 Supine figure 4 stretch 6 x 20 seconds Supine gluteal stretch (knee to opposite shoulder with opposite leg straight) 6 x 20 seconds Supine IT Band stretch (hamstrings stretch with hip adduction and IR, no strap used) 6 x 20 seconds left only Hip hike at counter top 2 sets 10 x 3 seconds (All therapeutic exercises required feedback and correction)  97530: Step-down off 2 and 4 inch step 10 x each side at each step height Review of techniques and tips for her HEP   03/19/2024 Supine figure 4 stretch 4 x 20 seconds Supine gluteal stretch (knee to opposite shoulder with opposite leg straight) 4 x 20 seconds Supine IT Band stretch (hamstrings stretch with hip adduction and IR, no strap used) 4 x 20 seconds left only Hip hike in door fram 5 x 3 seconds bilateral and at counter top 10 x 3 seconds  02469: Brief review of hip anatomy; exam findings and started day 1 HEP   PATIENT EDUCATION:  Education details: See above Person educated: Patient and Parent Education method: Explanation, Demonstration, Tactile cues, Verbal cues, and Handouts Education comprehension: verbalized  understanding, returned demonstration, verbal cues required, tactile cues required, and needs further education  HOME EXERCISE PROGRAM: Access Code: [email or web address] URL: [email or web address] Date: 03/19/2024 Prepared by: Lamar Ivory  Exercises - Supine Figure 4 Piriformis Stretch  - 2-3 x daily - 7 x weekly - 1 sets - 5 reps - 20 seconds hold - Supine Gluteus Stretch  - 2-3 x daily - 7 x weekly - 1 sets - 5 reps - 20 seconds hold - Supine ITB Stretch with Strap  - 2-3 x daily - 7 x weekly - 1 sets - 5 reps - 20 seconds hold - Standing Hip Hiking  - 3-5 x daily - 7 x weekly - 1 sets - 10 reps - 3 seconds hold  ASSESSMENT:  CLINICAL IMPRESSION: Raquel notes significant progress since her PT evaluation.  Her exercises did require quite a bit of correction and she had some soreness with more advanced activities today.  I encouraged continued HEP participation with a follow-up objective reassessment in a few weeks.  Patient is a 63 y.o. female who was seen today for physical therapy evaluation and treatment for  M25.552 (ICD-10-CM) - Pain in left hip  .  Significant clinical findings include limited left hip ER AROM, difficulty sleeping on the left side and stiffness with change of position.  Lydiana was started on an appropriate HEP to address impairments noted at today's evaluation.  OBJECTIVE IMPAIRMENTS: Abnormal gait, decreased activity tolerance, decreased endurance, decreased knowledge of condition, difficulty walking, decreased ROM, decreased strength, impaired perceived functional ability, impaired flexibility, obesity, and pain.   ACTIVITY LIMITATIONS:  sitting, standing, squatting, sleeping, stairs, and locomotion level  PARTICIPATION LIMITATIONS: driving, community activity, occupation, and church  PERSONAL FACTORS: Left ankle fracture, OA, kidney stones and HTN are also affecting patient's functional outcome.   REHAB POTENTIAL: Good  CLINICAL DECISION MAKING:  Stable/uncomplicated  EVALUATION COMPLEXITY: Low   GOALS: Goals reviewed with patient? Yes  SHORT TERM GOALS: Target date: 04/16/2024 Conor will be independent with her day 1 HEP Baseline: Started 03/19/2024 Goal status: Ongoing 04/10/2024  2.  Improve left hip ER AROM to 40 degrees Baseline: 24 degrees Goal status: INITIAL   LONG TERM GOALS: Target date: 05/15/2023  Improve Patient Specific Functional Score to 6 Baseline: 4 Goal status: INITIAL  2.  Improve left hip pain to consistently 2/10 or better Baseline: 3/10 Goal status: INITIAL  3.  Improve bilateral hip abduction strength as assessed by MMT Baseline: 4+/4 out of 5 MMT Goal status: INITIAL  4.  Deniece will be able to sleep on the left side Baseline: Unable at evaluation Goal status: INITIAL  5.  Sharri will be independent with her long-term HEP at DC Baseline: Started 03/19/2024 Goal status: INITIAL   PLAN:  PT FREQUENCY: 1-2x/week  PT DURATION: 8 weeks  PLANNED INTERVENTIONS: 97110-Therapeutic exercises, 97530- Therapeutic activity, [id number]- Neuromuscular re-education, 97535- Self Care, [id number]- Manual therapy, [id number]- Gait training, [phone] (1-2 muscles), [id number] (3+ muscles)- Dry Needling, Patient/Family education, Balance training, Stair training, Cryotherapy, and Moist heat  PLAN FOR NEXT SESSION: Review HEP.  Most significant clinical findings were ER tightness along with some hip abductors weakness.  Remeasure objective values.   Myer LELON Ivory, PT, MPT 04/10/2024, 5:28 PM

## 2024-04-29 ENCOUNTER — Encounter: Admitting: Rehabilitative and Restorative Service Providers"

## 2024-05-08 ENCOUNTER — Encounter: Admitting: Rehabilitative and Restorative Service Providers"

## 2024-05-12 ENCOUNTER — Other Ambulatory Visit: Payer: Self-pay

## 2024-05-12 DIAGNOSIS — N390 Urinary tract infection, site not specified: Secondary | ICD-10-CM

## 2024-05-12 DIAGNOSIS — M1990 Unspecified osteoarthritis, unspecified site: Secondary | ICD-10-CM | POA: Insufficient documentation

## 2024-05-12 DIAGNOSIS — R112 Nausea with vomiting, unspecified: Secondary | ICD-10-CM | POA: Insufficient documentation

## 2024-05-12 DIAGNOSIS — E739 Lactose intolerance, unspecified: Secondary | ICD-10-CM | POA: Insufficient documentation

## 2024-05-12 DIAGNOSIS — N8000 Endometriosis of the uterus, unspecified: Secondary | ICD-10-CM

## 2024-05-12 DIAGNOSIS — Z8619 Personal history of other infectious and parasitic diseases: Secondary | ICD-10-CM | POA: Insufficient documentation

## 2024-05-12 DIAGNOSIS — K769 Liver disease, unspecified: Secondary | ICD-10-CM | POA: Insufficient documentation

## 2024-05-12 DIAGNOSIS — Z87442 Personal history of urinary calculi: Secondary | ICD-10-CM | POA: Insufficient documentation

## 2024-05-12 DIAGNOSIS — K760 Fatty (change of) liver, not elsewhere classified: Secondary | ICD-10-CM | POA: Insufficient documentation

## 2024-05-12 DIAGNOSIS — K219 Gastro-esophageal reflux disease without esophagitis: Secondary | ICD-10-CM | POA: Insufficient documentation

## 2024-05-12 HISTORY — DX: Urinary tract infection, site not specified: N39.0

## 2024-05-12 HISTORY — DX: Endometriosis of the uterus, unspecified: N80.00

## 2024-05-22 ENCOUNTER — Ambulatory Visit: Admitting: Cardiology

## 2024-05-30 ENCOUNTER — Ambulatory Visit: Admitting: Cardiology

## 2024-06-20 ENCOUNTER — Ambulatory Visit: Admitting: Cardiology
